# Patient Record
Sex: Female | Born: 1935
Health system: Southern US, Community
[De-identification: ages and names within clinical notes are randomized; demographics above are authoritative.]

## PROBLEM LIST (undated history)

## (undated) DIAGNOSIS — I10 Essential (primary) hypertension: Secondary | ICD-10-CM

## (undated) DIAGNOSIS — E871 Hypo-osmolality and hyponatremia: Secondary | ICD-10-CM

## (undated) HISTORY — PX: HUMERUS FRACTURE SURGERY: SHX670

---

## 1998-07-12 ENCOUNTER — Emergency Department (HOSPITAL_COMMUNITY): Admission: EM | Admit: 1998-07-12 | Discharge: 1998-07-12 | Payer: Self-pay | Admitting: Emergency Medicine

## 2010-10-09 ENCOUNTER — Inpatient Hospital Stay (HOSPITAL_COMMUNITY)
Admission: EM | Admit: 2010-10-09 | Discharge: 2010-10-14 | Payer: Self-pay | Source: Home / Self Care | Attending: Internal Medicine | Admitting: Internal Medicine

## 2010-10-14 LAB — POCT I-STAT, CHEM 8
Calcium, Ion: 1.16 mmol/L (ref 1.12–1.32)
Chloride: 90 mEq/L — ABNORMAL LOW (ref 96–112)
HCT: 38 % (ref 36.0–46.0)
Sodium: 125 mEq/L — ABNORMAL LOW (ref 135–145)
TCO2: 29 mmol/L (ref 0–100)

## 2010-10-14 LAB — DIFFERENTIAL
Basophils Relative: 0 % (ref 0–1)
Eosinophils Absolute: 0 10*3/uL (ref 0.0–0.7)
Lymphs Abs: 1.5 10*3/uL (ref 0.7–4.0)
Neutro Abs: 10.8 10*3/uL — ABNORMAL HIGH (ref 1.7–7.7)
Neutrophils Relative %: 80 % — ABNORMAL HIGH (ref 43–77)

## 2010-10-14 LAB — CBC
HCT: 30 % — ABNORMAL LOW (ref 36.0–46.0)
Hemoglobin: 12.1 g/dL (ref 12.0–15.0)
Hemoglobin: 10.4 g/dL — ABNORMAL LOW (ref 12.0–15.0)
Hemoglobin: 10.3 g/dL — ABNORMAL LOW (ref 12.0–15.0)
Platelets: 350 10*3/uL (ref 150–400)
Platelets: 299 10*3/uL (ref 150–400)
RBC: 3.86 MIL/uL — ABNORMAL LOW (ref 3.87–5.11)
RBC: 3.4 MIL/uL — ABNORMAL LOW (ref 3.87–5.11)
RBC: 3.28 MIL/uL — ABNORMAL LOW (ref 3.87–5.11)
WBC: 13.4 10*3/uL — ABNORMAL HIGH (ref 4.0–10.5)
WBC: 6.4 10*3/uL (ref 4.0–10.5)
WBC: 8 10*3/uL (ref 4.0–10.5)

## 2010-10-14 LAB — BASIC METABOLIC PANEL
CO2: 24 mEq/L (ref 19–32)
CO2: 25 mEq/L (ref 19–32)
Calcium: 8.6 mg/dL (ref 8.4–10.5)
Calcium: 8.3 mg/dL — ABNORMAL LOW (ref 8.4–10.5)
Chloride: 91 mEq/L — ABNORMAL LOW (ref 96–112)
Creatinine, Ser: 0.54 mg/dL (ref 0.4–1.2)
GFR calc Af Amer: >60 mL/min (ref >60)
Glucose, Bld: 206 mg/dL — ABNORMAL HIGH (ref 70–99)
Potassium: 3.2 mEq/L — ABNORMAL LOW (ref 3.5–5.1)
Sodium: 131 mEq/L — ABNORMAL LOW (ref 135–145)
Sodium: 125 mEq/L — ABNORMAL LOW (ref 135–145)

## 2010-10-14 LAB — LIPID PANEL
Cholesterol: 161 mg/dL (ref 0–200)
HDL: 67 mg/dL (ref >39)
Total CHOL/HDL Ratio: 2.4 RATIO

## 2010-10-14 LAB — COMPREHENSIVE METABOLIC PANEL
Albumin: 3 g/dL — ABNORMAL LOW (ref 3.5–5.2)
Alkaline Phosphatase: 69 U/L (ref 39–117)
BUN: 19 mg/dL (ref 6–23)
CO2: 27 mEq/L (ref 19–32)
Chloride: 93 mEq/L — ABNORMAL LOW (ref 96–112)
Glucose, Bld: 115 mg/dL — ABNORMAL HIGH (ref 70–99)
Potassium: 4 mEq/L (ref 3.5–5.1)
Total Bilirubin: 0.3 mg/dL (ref 0.3–1.2)

## 2010-10-14 LAB — URINE MICROSCOPIC-ADD ON

## 2010-10-14 LAB — URINALYSIS, ROUTINE W REFLEX MICROSCOPIC
Leukocytes, UA: NEGATIVE
Nitrite: POSITIVE — AB
Specific Gravity, Urine: 1.014 (ref 1.005–1.030)
pH: 7.5 (ref 5.0–8.0)

## 2010-10-14 LAB — CK TOTAL AND CKMB (NOT AT ARMC)
CK, MB: 3.5 ng/mL (ref 0.3–4.0)
Total CK: 149 U/L (ref 7–177)

## 2010-10-14 LAB — HEMOGLOBIN A1C: Hgb A1c MFr Bld: 5.8 % — ABNORMAL HIGH (ref <5.7)

## 2010-10-14 LAB — CORTISOL: Cortisol, Plasma: 15.9 ug/dL

## 2010-10-15 LAB — BASIC METABOLIC PANEL
BUN: 13 mg/dL (ref 6–23)
BUN: 17 mg/dL (ref 6–23)
CO2: 31 mEq/L (ref 19–32)
CO2: 27 mEq/L (ref 19–32)
Calcium: 8.6 mg/dL (ref 8.4–10.5)
Chloride: 91 mEq/L — ABNORMAL LOW (ref 96–112)
Chloride: 94 mEq/L — ABNORMAL LOW (ref 96–112)
Creatinine, Ser: 0.4 mg/dL (ref 0.4–1.2)
Creatinine, Ser: 0.45 mg/dL (ref 0.4–1.2)
Creatinine, Ser: 0.44 mg/dL (ref 0.4–1.2)
GFR calc Af Amer: >60 mL/min (ref >60)
GFR calc non Af Amer: >60 mL/min (ref >60)
Glucose, Bld: 97 mg/dL (ref 70–99)
Glucose, Bld: 127 mg/dL — ABNORMAL HIGH (ref 70–99)
Potassium: 3.3 mEq/L — ABNORMAL LOW (ref 3.5–5.1)
Potassium: 4.7 mEq/L (ref 3.5–5.1)
Sodium: 129 mEq/L — ABNORMAL LOW (ref 135–145)
Sodium: 127 mEq/L — ABNORMAL LOW (ref 135–145)

## 2010-10-15 LAB — GLUCOSE, CAPILLARY
Glucose-Capillary: 122 mg/dL — ABNORMAL HIGH (ref 70–99)
Glucose-Capillary: 123 mg/dL — ABNORMAL HIGH (ref 70–99)
Glucose-Capillary: 94 mg/dL (ref 70–99)
Glucose-Capillary: 152 mg/dL — ABNORMAL HIGH (ref 70–99)

## 2010-10-15 LAB — CBC
HCT: 33.5 % — ABNORMAL LOW (ref 36.0–46.0)
Hemoglobin: 11.3 g/dL — ABNORMAL LOW (ref 12.0–15.0)
MCH: 31.1 pg (ref 26.0–34.0)
MCHC: 33.7 g/dL (ref 30.0–36.0)
MCV: 92.3 fL (ref 78.0–100.0)
RBC: 3.63 MIL/uL — ABNORMAL LOW (ref 3.87–5.11)

## 2010-10-15 LAB — URINALYSIS, ROUTINE W REFLEX MICROSCOPIC
Nitrite: POSITIVE — AB
Protein, ur: 30 mg/dL — AB
Urobilinogen, UA: 1 mg/dL (ref 0.0–1.0)

## 2010-10-15 LAB — URINE MICROSCOPIC-ADD ON

## 2010-10-15 NOTE — Consult Note (Signed)
  NAMEARGENTINA, Gina Zavala                 ACCOUNT NO.:  [id number]  MEDICAL RECORD NO.:  [id number]          PATIENT TYPE:  INP  LOCATION:  3703                         FACILITY:  [facility]  PHYSICIAN:  Jene Every, M.D.    DATE OF BIRTH:  07-May-1922  DATE OF CONSULTATION:  10/09/2010 DATE OF DISCHARGE:                                CONSULTATION   CHIEF COMPLAINT:  Left shoulder pain.  HISTORY:  An 77 year old female who fell yesterday at home on to her left side.  She was admitted to the hospital with hyponatremia. Complains of hip and shoulder pain.  The shoulder indicated an impacted fracture of the humeral neck.  X-ray of the hip was negative and we are consulted for treatment of her surgical neck fracture of the humerus.  She denies numbness or tingling, fevers, or chills.  PAST MEDICAL HISTORY:  Significant for dementia.  Unclear as to the etiology of the patient's fall, anxiety, and hypertension.  SOCIAL HISTORY:  Negative tobacco.  Negative EtOH.  Nonsmoker.  Lives alone.  FAMILY HISTORY:  Noncontributory.  MEDICATIONS:  Antihypertensive medication.  PHYSICAL EXAMINATION:  GENERAL:  Elderly, mild distress.  Mood and affect appropriate.  In a sling in the hospital bed. VITALS:  Afebrile, pulse 98, BP 167/70. HEENT:  Within normal limits. COR:  Regular rate and rhythm. ABDOMEN:  Soft, nontender.  She has bruising of the left shoulder.  Pain with attempted motion of the shoulder. MUSCULOSKELETAL:  Compartments are soft.  Pulses in the radius and ulna distally are intact, good grip strength.  Forearm and elbow exam is unremarkable.  She has mild deformity and tenderness in the lumbosacral junction. BACK:  No pain on range of motion of the hip. PELVIS:  Stable. NEUROLOGIC:  Sensory exam is intact.  Radiographs of the shoulder demonstrates impacted fracture of the surgical neck, slightly displaced.  X-rays of the hip demonstrates no evidence of fracture.  LABORATORY  EVALUATION:  Sodium 145.  WBC is 13.4.  IMPRESSION:  Closed acute fracture of the surgical neck moderately displaced left humerus with neurologically intact.  Compartments are soft, history of dementia.  PLAN:  Discussed with she and her daughter.  Recommend conservative treatment at this point in time.  Ice and neurovascular checks with vital signs of the upper extremity.  Avoid flexion of the elbow beyond 90 degrees, avoid swelling in the hand, encourage range of motion of the hand and digits.  Follow up with Dr. Shelle Iron as an outpatient in 2 weeks for repeat radiographs and discuss possibility of hemiarthroplasty.     Jene Every, M.D.     [name]  D:  10/09/2010  T:  10/10/2010  Job:  [id number]  Electronically Signed by Jene Every M.D. on 10/15/2010 12:17:20 PM

## 2010-10-16 LAB — URINE CULTURE
Colony Count: >=100000
Culture  Setup Time: 201201221734

## 2010-10-28 NOTE — Discharge Summary (Signed)
NAME:  Gina Zavala, Gina Zavala NO.:  [id number]  MEDICAL RECORD NO.:  [id number]          PATIENT TYPE:  INP  LOCATION:  3703                         FACILITY:  [facility]  PHYSICIAN:  Mariea Stable, MD   DATE OF BIRTH:  [birth year]  DATE OF ADMISSION:  10/09/2010 DATE OF DISCHARGE:  10/14/2010                              DISCHARGE SUMMARY   DISCHARGE PHYSICIAN:  Mariea Stable, MD  DISCHARGE DIAGNOSES: 1. Left humerus fracture. 2. History of multiple falls. 3. Hyponatremia. 4. Hypertension.  DISCHARGE MEDICATIONS: 1. Amlodipine 5 mg tabs, take 1 tablet by mouth daily (Diuretics stopped due to chronic hyponatremia) 2. Ciprofloxacin 500 mg tabs, take 1 tablet twice daily by mouth. 3. Vicodin 5/325 mg tabs, take 1 tablet by mouth every 4 hours as     needed for pain. 4. Ibuprofen 200 mg tabs, take 1-2 tablets by mouth every 8 hours as     needed for pain.  DISPOSITION AND FOLLOWUP:  The patient is to follow up with Dr. Merla Riches, her primary care physician, his phone number is [phone] on January 30, between 2 p.m. and 8 p.m.  Because of lack of availability, the patient was asked to come to the walk-in clinic.  At that visit, the patient should have a BMET checked given that she continues to be hyponatremic here in the hospital though this appears to be chronic per her history.  This was stable during her hospitalization.  It is very important that the patient's safety at home living independently be continued to be discussed with the patient at that visit as it is possible that she may not be able to return home to live independently after her stay at the skilled nursing facility due to this acute humerus fracture.  The patient should followup with Dr. Shelle Iron in [facility], phone number [phone] on October 24, 2010 at 2:30 p.m.  At this visit, Dr. Shelle Iron is planning to perform x-rays of the fracture at that time to determine what future management may  be necessary.  The possibility of hemiarthroplasty will also be considered at that time. We will defer to Dr. Shelle Iron for further orthopedic management of this patient's fracture.  PROCEDURES PERFORMED: 1. Left complete hip x-ray was performed on October 09, 2010, which     demonstrated diffuse osteopenia, but no acute fracture. 2. A left shoulder x-ray was performed on October 09, 2010, which     demonstrated an acute slightly impacted and angulated left humeral     neck fracture.  CONSULTATIONS:  Orthopedic Surgery.  BRIEF ADMITTING HISTORY AND PHYSICAL:  This is a 77 year old female with no significant past medical history other than hypertension who presented to the ED with the main concern of left shoulder pain and swelling that started the day prior to admission.  The patient reportedly fell at 5:30 p.m. on the day prior to admission, this was secondary to a mechanical fall and the patient denied any loss of consciousness at that time.  She was unfortunately unable to get up and fortunately the patient's daughter found her and was able to assist her and brought  her to the ED.  The left arm was very painful and the daughter upon arrival to the ED noted multiple episodes of falls recently.  When the patient was further pressed on the issue, she does state that she does have some presyncopal symptoms prior to these episodes, but denied any presyncopal symptoms prior to the episode that lead to her fracture.  The patient on admission denied any chest pain, shortness of breath, change in her bowel movements or a recent fevers or chills.  ALLERGIES:  No known drug allergies.  PAST MEDICAL HISTORY:  Hypertension and chronic hyponatremia x2 years.  MEDICATIONS:  Bisoprolol and hydrochlorothiazide daily.  PHYSICAL EXAMINATION:  ADMISSION VITAL SIGNS:  Temperature 98.5, blood pressure 167/73, pulse 98, respiratory rate 19, satting at 96% on room air. GENERAL:  The patient was  sleepy, tired appearing, lying in bed. EYES:  Pupils are equal, round, reactive to light and accommodation. ENT:  Dry mucous membranes.  No oropharyngeal erythema or signs of head trauma. NECK:  Supple.  No thyroid enlargement, but there were bilateral carotid bruits. RESPIRATORY:  Good air movement bilaterally.  Minimal crackles at the bases.  No wheezing. CARDIOVASCULAR:  Regular rate and rhythm.  3/6 systolic murmur.  No JVD. Normal S1 and S2. GI: Soft, nontender, nondistended.  Normal bowel sounds. NEUROLOGIC:  No focal abnormalities. MUSCULOSKELETAL:  There is left shoulder tenderness and diffuse edema with a 5-cm ecchymosis in the medial aspect of the left arm.  LABORATORY DATA:  Labs on admission, white blood cell count 13.4, hemoglobin 12.1, hematocrit 35.1, platelets 350.  BMET, sodium 125, potassium 4.0, chloride 90, bicarb 29, BUN 25, creatinine 0.7, glucose of 158.  HOSPITAL COURSE BY PROBLEM: 1. Left humeral fracture:  This was evaluated by x-ray which     demonstrated an impacted and angulated left humeral neck fracture.     An Orthopedic Surgery consult was received and they recommended  conservative treatment with ice and neurovascular checks with vital     signs of the upper extremity.  They also recommended avoidance of     flexion of the elbow beyond 90 degrees to avoid swelling of the     hand and they recommended encouragement of range of motion exercise     in the hand and digits.  They wish to follow up with the patient in     2 weeks for repeat radiographs and discussion of possible     hemiarthroplasty.  The patient's pain was under good control on     only Vicodin and ibuprofen.  While she was in the hospital, she was     discharged with the same medications to assist in further pain     management. 2. History of multiple falls:  Upon further questioning, the patient     relates presyncopal symptoms during many of these falls and she was     placed on our  telemetry unit.  No arrhythmias were noted and her     EKG simply demonstrated normal sinus rhythm with a consideration of     left ventricular hypertrophy.  We did not have prior examination     with which to compare.  Orthostatic vital signs were gathered, but     the patient proved to not be orthostatic.  The patient was     evaluated by PT and OT and it was felt that she had a very steady     gait unrelated to her current fracture with excessive backward  leaning on while walking.  Because of this, it was recommended that     the patient go to skilled nursing facility after discharge for     further physical therapy and geriatric assessment to determine     whether or not the patient will be safe in the future to return     back home. 3. Hyponatremia:  The patient's sodium was 125 on admission.  This     corrected easily with normal saline administration.  On the day of     discharge, the patient's sodium had improved to 131.  Our     understanding is that this has been chronic and worked up by Dr.     Shelle Iron in the past and it was only recommended that the patient     increase her salt intake.  We did check cortisol and TSH levels     here as the patient appears to be euvolemic and the fact that her     sodium corrected so easily decreases the likelihood that she has     SIADH.  We did recommend that the patient continue her high-salt     diet and recommend that Dr. Shelle Iron continue to follow this for     further evaluation as the etiology continues to be unclear at this     time, but may be related to the patient's diuretic use on     admission.  As such, we did change her diuretic to Norvasc and her     blood pressure was well-controlled here. 4. Hypertension:  As mentioned above, the patient was previously on     both the diuretics and beta-blocker combination pill.  We     discontinued these on admission to the hospital and decided to     start her on 5 mg daily of Norvasc.  The  patient tolerated this     medication well and we would recommended continuation as it has     decreased the likelihood of causing electrolyte abnormalities.  On     the day of discharge, the patient's blood pressure was slightly     elevated and the last systolic reading was 149/77.  Given the     patient's age, we elected not to increase her dose especially since     she has had these episodes of syncope in the past, but recommend     continued following of the patient's blood pressure and adjustment     of her antihypertensive regimen should that felt to be necessary. 5. Osteopenia:  The patient's hip x-ray demonstrated diffuse     osteopenia, but this obviously was not quantified.  To our     knowledge, the patient has not had a DEXA scan and we would     recommend outpatient follow up with DEXA scan and consideration of     bisphosphonate or at least calcium and vitamin D supplementation.  DISCHARGE LABS:  On the day of discharge, the patient's white blood cell count was 7.9, hemoglobin 11.3, hematocrit 33.5, platelet count 400. BMET, sodium was 131, potassium 4.7, chloride 94, bicarb 27, glucose 127, BUN of 17, creatinine of 0.44.  DISCHARGE VITALS:  On the discharge, the patient's temperature was 98.4, pulse 78, respirations 20, blood pressure was 149/77, and her oxygen saturation was 97% on room air.     Sinda Du, MD   ______________________________ Mariea Stable, MD    BB/MEDQ  D:  10/14/2010  T:  10/14/2010  Job:  [id number]  cc:   Harrel Lemon. Merla Riches, M.D.  Electronically Signed by Sinda Du MD on 10/28/2010 08:53:49 AM Electronically Signed by Mariea Stable MD on 10/28/2010 09:30:36 AM

## 2011-09-01 ENCOUNTER — Encounter: Payer: Self-pay | Admitting: Internal Medicine

## 2011-09-01 DIAGNOSIS — N814 Uterovaginal prolapse, unspecified: Secondary | ICD-10-CM

## 2011-09-01 DIAGNOSIS — I10 Essential (primary) hypertension: Secondary | ICD-10-CM | POA: Insufficient documentation

## 2011-09-01 DIAGNOSIS — E871 Hypo-osmolality and hyponatremia: Secondary | ICD-10-CM

## 2011-09-01 DIAGNOSIS — R0989 Other specified symptoms and signs involving the circulatory and respiratory systems: Secondary | ICD-10-CM

## 2011-09-01 DIAGNOSIS — M419 Scoliosis, unspecified: Secondary | ICD-10-CM

## 2011-10-15 ENCOUNTER — Ambulatory Visit (INDEPENDENT_AMBULATORY_CARE_PROVIDER_SITE_OTHER): Payer: Medicare Other | Admitting: Internal Medicine

## 2011-10-15 DIAGNOSIS — I10 Essential (primary) hypertension: Secondary | ICD-10-CM

## 2011-10-15 DIAGNOSIS — M810 Age-related osteoporosis without current pathological fracture: Secondary | ICD-10-CM

## 2011-10-28 ENCOUNTER — Other Ambulatory Visit: Payer: Self-pay

## 2011-10-28 MED ORDER — AMLODIPINE BESYLATE 5 MG PO TABS: 5.0000 mg | ORAL_TABLET | Freq: Every day | ORAL | Status: DC

## 2011-12-17 ENCOUNTER — Ambulatory Visit: Payer: Medicare Other | Admitting: Internal Medicine

## 2011-12-18 ENCOUNTER — Telehealth: Payer: Self-pay

## 2011-12-18 NOTE — Telephone Encounter (Signed)
Dr. Merla Riches Patient   Has diarrhea and needs something to help now.  She is in assisted living at [facility] on [address]. Certain she has stomach virus.  Keopectade. Write an order and send to [facility]

## 2011-12-19 MED ORDER — BISMUTH SUBSALICYLATE 262 MG PO TABS: 1.0000 | ORAL_TABLET | Freq: Four times a day (QID) | ORAL | Status: DC | PRN

## 2011-12-19 NOTE — Telephone Encounter (Signed)
Called [facility] and they stated we can fax in rx to [facility] and they will dispense to patient.  Rx faxed to [phone].

## 2011-12-19 NOTE — Telephone Encounter (Signed)
Sure.  Gina Zavala

## 2011-12-19 NOTE — Telephone Encounter (Signed)
Can we do this ?

## 2012-01-14 ENCOUNTER — Ambulatory Visit (INDEPENDENT_AMBULATORY_CARE_PROVIDER_SITE_OTHER): Payer: Medicare Other | Admitting: Internal Medicine

## 2012-01-14 ENCOUNTER — Encounter: Payer: Self-pay | Admitting: Internal Medicine

## 2012-01-14 VITALS — BP 155/74 | HR 89 | Temp 98.5°F | Resp 18 | Ht <= 58 in | Wt 81.0 lb

## 2012-01-14 DIAGNOSIS — R0989 Other specified symptoms and signs involving the circulatory and respiratory systems: Secondary | ICD-10-CM | POA: Insufficient documentation

## 2012-01-14 DIAGNOSIS — N814 Uterovaginal prolapse, unspecified: Secondary | ICD-10-CM

## 2012-01-14 DIAGNOSIS — I10 Essential (primary) hypertension: Secondary | ICD-10-CM

## 2012-01-14 DIAGNOSIS — E871 Hypo-osmolality and hyponatremia: Secondary | ICD-10-CM

## 2012-01-14 DIAGNOSIS — M810 Age-related osteoporosis without current pathological fracture: Secondary | ICD-10-CM | POA: Insufficient documentation

## 2012-01-14 NOTE — Progress Notes (Signed)
  Subjective:    Patient ID: Gina Zavala, female    DOB: May 28, 1922, 78 y.o.   MRN: [id number]  HPIHere for followup 1. HTN (hypertension)   2. Hyponatremia   3. Uterine prolapse    Dr McDiarmid wants to do urodynamics --her incontinence is greatly affecting her life and Dr. Ambrose Mantle has declined to do surgery From a GYN aspect. She is distressed that she can see a vaginal prolapse and she is also troubled by fissuring of the skin in the rectovaginal area.  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  Eyes: Negative for visual disturbance.  Respiratory: Negative for cough and shortness of breath.   Cardiovascular: Negative for chest pain, palpitations and leg swelling.  Neurological: Negative for tremors and speech difficulty.  Psychiatric/Behavioral: Negative for behavioral problems, sleep disturbance, dysphoric mood, decreased concentration and agitation.       Objective:   Physical ExamVital signs are stable with her usual systolic hypertension Pupils equal round reactive to light EOMs conjugate Lungs are clear Heart is regular Carotid bruit on the right Neurological intact         Assessment & Plan:   1. HTN (hypertension)   2. Hyponatremia   3. Uterine prolapse    She will continue on Norvasc 5 mg She will proceed with urodynamics and potentially surgery She will followup in 3 months to recheck labs regarding hyponatremia She will continue vitamin D and calcium for her  osteoporosis Dr. Shelle Iron evaluated her marked scoliosis last month and said he thought she was fine and needed no further intervention/she had considered acupuncture but will wait to schedule this

## 2012-01-22 ENCOUNTER — Other Ambulatory Visit: Payer: Self-pay | Admitting: *Deleted

## 2012-01-22 MED ORDER — AMLODIPINE BESYLATE 5 MG PO TABS: 5.0000 mg | ORAL_TABLET | Freq: Every day | ORAL | Status: DC

## 2012-02-06 ENCOUNTER — Ambulatory Visit (INDEPENDENT_AMBULATORY_CARE_PROVIDER_SITE_OTHER): Payer: Medicare Other | Admitting: Internal Medicine

## 2012-02-06 VITALS — BP 206/71 | HR 88 | Temp 98.0°F | Resp 16 | Ht <= 58 in | Wt 80.4 lb

## 2012-02-06 DIAGNOSIS — I10 Essential (primary) hypertension: Secondary | ICD-10-CM

## 2012-02-07 ENCOUNTER — Encounter: Payer: Self-pay | Admitting: Internal Medicine

## 2012-02-07 NOTE — Progress Notes (Signed)
  Subjective:    Patient ID: Gina Zavala, female    DOB: 1922-01-20, 77 y.o.   MRN: [id number]  HPIMs. 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 She continues to do well in her assisted living arrangement, using a walker for support. She has the following issues Patient Active Problem List  Diagnoses  . HTN (hypertension)  . Scoliosis  . Carotid bruit present  . Hyponatremia  . Uterine prolapse  . Carotid bruit  . Osteoporosis  Her osteoporosis and scoliosis lead to a fair amount of back discomfort for which she consults with Dr. Shelle Iron.  She has no current complaint. Her systolic hypertension has been difficult to control. She has suffered hypotension and syncope with several medications, and she refuses to take medications as soon as she becomes dizzy. Her blood pressures at her living institution are much better than the ones when she comes into our office. Her uterine prolapse causes urinary problems which are currently under evaluation     Review of SystemsShe has no other complaints/at the current time she is extremely excited about this opportunity for publication of her life's work     Objective:   Physical Exam Weight is stable Hearing is adequate in conversation Gait is stable with walker Heart has a regular rhythm No peripheral vascular compromises Neurological  is intact Thought and judgment are intact        Assessment & Plan:  She is indeed medically able to travel to support the publication of her music and poetry  She will followup for blood work as planned before continuing her prescriptions I continue to honor her wishes for the least amount of intervention possible

## 2012-03-21 ENCOUNTER — Other Ambulatory Visit: Payer: Self-pay | Admitting: Family Medicine

## 2012-03-21 MED ORDER — AMLODIPINE BESYLATE 5 MG PO TABS: 5.0000 mg | ORAL_TABLET | Freq: Every day | ORAL | Status: DC

## 2012-04-14 ENCOUNTER — Ambulatory Visit (INDEPENDENT_AMBULATORY_CARE_PROVIDER_SITE_OTHER): Payer: Medicare Other | Admitting: Internal Medicine

## 2012-04-14 ENCOUNTER — Encounter: Payer: Self-pay | Admitting: Internal Medicine

## 2012-04-14 ENCOUNTER — Telehealth: Payer: Self-pay

## 2012-04-14 VITALS — BP 160/76 | HR 82 | Temp 98.4°F | Resp 18 | Ht <= 58 in | Wt 79.0 lb

## 2012-04-14 DIAGNOSIS — J301 Allergic rhinitis due to pollen: Secondary | ICD-10-CM

## 2012-04-14 DIAGNOSIS — R350 Frequency of micturition: Secondary | ICD-10-CM

## 2012-04-14 DIAGNOSIS — I10 Essential (primary) hypertension: Secondary | ICD-10-CM

## 2012-04-14 DIAGNOSIS — N814 Uterovaginal prolapse, unspecified: Secondary | ICD-10-CM

## 2012-04-14 DIAGNOSIS — E871 Hypo-osmolality and hyponatremia: Secondary | ICD-10-CM

## 2012-04-14 DIAGNOSIS — H6980 Other specified disorders of Eustachian tube, unspecified ear: Secondary | ICD-10-CM

## 2012-04-14 MED ORDER — FLUTICASONE PROPIONATE 50 MCG/ACT NA SUSP: 2.0000 | Freq: Every day | NASAL | Status: DC

## 2012-04-14 NOTE — Progress Notes (Signed)
  Subjective:    Patient ID: Gina Zavala, female    DOB: 10/06/1921, 78 y.o.   MRN: [id number]  HPI Patient Active Problem List  Diagnosis  . HTN (hypertension)-Has had no change in symptoms/complaints that her blood pressure medicine causes a dry mouth and would like to consider a different medication/she has had side effects to other medications and we have changed several times to reach current dose of amlodipine 5 mg  . Scoliosis  . Carotid bruit present  . Hyponatremia-Time to recheck labs/no change in sensorium/no polyuria polydipsia/no neurological changes  . Uterine prolapse-pessary removed by Dr Thurmond Butts voiding somewhat better/If urinary frequency increases she will followup with urology to consider hysterectomy for other alternatives  . Carotid bruit  . Osteoporosis   Today she has a new complaint of ringing in her right ear and a feeling of fullness with muffled hearing in the left ear that comes and goes. This has been increasing over the last 2 or 3 months. She also has a lot of postnasal drip and a lot of congestion in the eyes with irritation especially with her roommates use of Frag[employer]. Her left ear pops when she swallows.  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  Eyes: Negative for visual disturbance.  Respiratory: Negative for shortness of breath.   Cardiovascular: Negative for chest pain and leg swelling.  Neurological: Negative for dizziness, syncope and weakness.  Psychiatric/Behavioral: Negative for behavioral problems, confusion, disturbed wake/sleep cycle, dysphoric mood and decreased concentration. The patient is not nervous/anxious.        Objective:   Physical Exam Filed Vitals:   04/14/12  1407  BP: 160/76  Pulse: 82  Temp: 98.4 F (36.9 C)  Resp: 18   In no acute distress Tympanic membranes and canals are clear Nares boggy with clear rhinorrhea No nodes or thyromegaly Heart regular with no murmur No peripheral edema/full peripheral pulses Oriented well Gait with walker good        Assessment & Plan:   1. Essential hypertension, benign  CBC with Differential, Lipid panel, Comprehensive metabolic panel,   2. Allergic rhinitis   3. Hyposmolality and/or hyponatremia  CBC with Differential, Lipid panel, Comprehensive metabolic panel, Osmolality  4. ETD (eustachian tube dysfunction) -Secondary #2 Flonase daily to be picked up by her daughter and brought to the nursing home so she can control  use  5. Frequency of urination    6. Uterine prolapse     She will followup in one week so that blood work in the fasting

## 2012-04-15 ENCOUNTER — Other Ambulatory Visit (INDEPENDENT_AMBULATORY_CARE_PROVIDER_SITE_OTHER): Payer: Medicare Other | Admitting: Family Medicine

## 2012-04-15 DIAGNOSIS — I10 Essential (primary) hypertension: Secondary | ICD-10-CM

## 2012-04-15 DIAGNOSIS — J301 Allergic rhinitis due to pollen: Secondary | ICD-10-CM

## 2012-04-15 DIAGNOSIS — E871 Hypo-osmolality and hyponatremia: Secondary | ICD-10-CM

## 2012-04-15 LAB — CBC WITH DIFFERENTIAL/PLATELET
Basophils Relative: 0 % (ref 0–1)
Eosinophils Absolute: 0.1 10*3/uL (ref 0.0–0.7)
Eosinophils Relative: 2 % (ref 0–5)
Lymphs Abs: 2.1 10*3/uL (ref 0.7–4.0)
MCH: 31.2 pg (ref 26.0–34.0)
MCHC: 34.2 g/dL (ref 30.0–36.0)
MCV: 91.2 fL (ref 78.0–100.0)
Monocytes Relative: 9 % (ref 3–12)
Neutrophils Relative %: 56 % (ref 43–77)
Platelets: 396 10*3/uL (ref 150–400)
RBC: 4.68 MIL/uL (ref 3.87–5.11)

## 2012-04-15 LAB — LIPID PANEL
Cholesterol: 221 mg/dL — ABNORMAL HIGH (ref 0–200)
Total CHOL/HDL Ratio: 2.8 Ratio
VLDL: 16 mg/dL (ref 0–40)

## 2012-04-15 LAB — COMPREHENSIVE METABOLIC PANEL
AST: 24 U/L (ref 0–37)
Albumin: 4.6 g/dL (ref 3.5–5.2)
Alkaline Phosphatase: 103 U/L (ref 39–117)
Calcium: 10.6 mg/dL — ABNORMAL HIGH (ref 8.4–10.5)
Chloride: 95 mEq/L — ABNORMAL LOW (ref 96–112)
Potassium: 4.2 mEq/L (ref 3.5–5.3)
Sodium: 133 mEq/L — ABNORMAL LOW (ref 135–145)
Total Protein: 7.1 g/dL (ref 6.0–8.3)

## 2012-04-22 ENCOUNTER — Encounter: Payer: Self-pay | Admitting: Internal Medicine

## 2012-05-19 ENCOUNTER — Ambulatory Visit (INDEPENDENT_AMBULATORY_CARE_PROVIDER_SITE_OTHER): Payer: Medicare Other | Admitting: Internal Medicine

## 2012-05-19 ENCOUNTER — Encounter: Payer: Self-pay | Admitting: Internal Medicine

## 2012-05-19 VITALS — HR 67 | Temp 97.9°F | Resp 16 | Wt 76.8 lb

## 2012-05-19 DIAGNOSIS — E871 Hypo-osmolality and hyponatremia: Secondary | ICD-10-CM

## 2012-05-19 DIAGNOSIS — I10 Essential (primary) hypertension: Secondary | ICD-10-CM

## 2012-05-19 NOTE — Progress Notes (Signed)
  Subjective:    Patient ID: Gina Zavala, female    DOB: [birth year], 78 y.o.   MRN: [id number]  HPI Patient Active Problem List  Diagnosis  . HTN (hypertension)  . Scoliosis  . Carotid bruit present  . Hyponatremia  . Uterine prolapse  . Carotid bruit  . 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 HTN (hypertension)  . Scoliosis  . Carotid bruit present  . Hyponatremia  . Uterine prolapse  . Carotid bruit  . Osteoporosis  No need to repeat labs since everything was improving or stable in July Continue flonase  Til November No change in BP meds

## 2012-07-03 ENCOUNTER — Telehealth: Payer: Self-pay | Admitting: Family Medicine

## 2012-07-03 NOTE — Telephone Encounter (Signed)
Called patient and the number is [employer]. They transferred the call and it just rang. No answer and unable to leave message. Called home number listed and it is no longer in service. Will try back later.

## 2012-07-03 NOTE — Telephone Encounter (Signed)
Message copied by Eddie Candle on Sat Jul 03, 2012  8:31 AM ------      Message from: Tonye Pearson      Created: Fri Jul 02, 2012  4:24 PM      Regarding: FWLANDY MACE      Contact: [phone]       Can you find out about her message and I'll deal w/ it tomorrow?      ----- Message -----         From: Leonia Reeves         Sent: 06/28/2012   9:30 AM           To: Tonye Pearson, MD      Subject: Heloise Ochoa                                              Dr. Merla Riches,       I had the pleasure of speaking with Ms. Nies this morning. She is adamant to speak with you concerning her medication. She believes that it is causing her to have blurred vision. I informed her that you do not have any appointments until the end of the month but she says she needs to see you and no one else.       Her number is [phone]            Thanks,       Iran Planas.

## 2012-07-07 ENCOUNTER — Telehealth: Payer: Self-pay | Admitting: Radiology

## 2012-07-07 NOTE — Telephone Encounter (Signed)
Patient c/o dry mouth. She had one episode today of low o2 sats her aide is advised to bring her here or to ER today. She is now showing 02 sat of 97%

## 2012-07-07 NOTE — Telephone Encounter (Signed)
Left a message for her at [facility] place, she is not up yet. She is going to call me back.

## 2012-07-07 NOTE — Telephone Encounter (Signed)
Message copied by Caffie Damme on Wed Jul 07, 2012  9:19 AM ------      Message from: Honor Loh      Created: Tue Jul 06, 2012 11:32 AM      Regarding: FWHeloise Ochoa      Contact: [phone]                   ----- Message -----         From: Tonye Pearson, MD         Sent: 07/02/2012   4:24 PM           To: [facility] Clinical Message Pool      Subject: FW: Heloise Ochoa                                          Can you find out about her message and I'll deal w/ it tomorrow?      ----- Message -----         From: Leonia Reeves         Sent: 06/28/2012   9:30 AM           To: Tonye Pearson, MD      Subject: Heloise Ochoa                                              Dr. Merla Riches,       I had the pleasure of speaking with Ms. Prichett this morning. She is adamant to speak with you concerning her medication. She believes that it is causing her to have blurred vision. I informed her that you do not have any appointments until the end of the month but she says she needs to see you and no one else.       Her number is [phone]            Thanks,       Iran Planas.

## 2012-07-21 ENCOUNTER — Encounter: Payer: Self-pay | Admitting: Internal Medicine

## 2012-07-21 ENCOUNTER — Ambulatory Visit (INDEPENDENT_AMBULATORY_CARE_PROVIDER_SITE_OTHER): Payer: Medicare Other | Admitting: Internal Medicine

## 2012-07-21 VITALS — BP 140/78 | HR 74 | Temp 98.0°F | Resp 18 | Ht <= 58 in | Wt 77.0 lb

## 2012-07-21 DIAGNOSIS — T887XXA Unspecified adverse effect of drug or medicament, initial encounter: Secondary | ICD-10-CM

## 2012-07-21 DIAGNOSIS — I10 Essential (primary) hypertension: Secondary | ICD-10-CM

## 2012-07-21 DIAGNOSIS — Z23 Encounter for immunization: Secondary | ICD-10-CM

## 2012-07-21 MED ORDER — SPIRONOLACTONE 25 MG PO TABS: 25.0000 mg | ORAL_TABLET | Freq: Every day | ORAL | Status: DC

## 2012-07-21 NOTE — Patient Instructions (Addendum)
Stop norvasc Start srironolactone 25mg  Record daily BP and send me record in 2 weeks Recheck 1 month

## 2012-07-21 NOTE — Progress Notes (Signed)
  Subjective:    Patient ID: Gina Zavala, female    DOB: October 21, 1921, 78 y.o.   MRN: [id number]  CC: 78 yo W F c/o double vision and dry mouth  HPI She c/o Norvasc giving her dry mouth and double vision.    Her mouth is so dry that she has had episodes of choking.  At one point she was choking in the hallway and her O2 was checked and found to be 80%.  She was getting out of the shower and when she went to look in the mirror she had double vision.  In the room she takes her glasses off and says her vision gets worse.  She recently saw her eye dr who did not find any problems.  She has done research on different bp medications and discusses her findings during the appt.  She expresses interest in trying a diuretic.  We discussed stopping all medications and getting daily bp's to see if her aforementioned symptoms resolve and how her bp is w/o medication.  She expressed concern about not being on any bp medication.  She says her bp is typically good but occasionally spikes especially when she is upset.  She lives in a nursing home and has access to daily bp readings.    PMHx: Patient Active Problem List  Diagnosis  . HTN (hypertension)  . Scoliosis  . Carotid bruit present  . Hyponatremia  . Uterine prolapse  . Carotid bruit  . 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   Vitals: Filed Vitals:   07/21/12 1348  BP: 166/90  Pulse: 74  Temp: 98 F (36.7 C)  Resp: 18  HEENT: Nontraumatic, EOMIT, Wears glasses, sees well grossly,normal to external exam, trachea midline Heart: Regular rate and rhythm Lungs: No acute respiratory distress, no audible wheezing MSK: Decreased bulk with normal tone Neuro: Alert, oriented CN II - XII grossly IT/gait aided by walker Vascular: Radial pulse  2/4, no extremity edema     Assessment & Plan:  Assessment: 1) Dry mouth 2) Blurry vision 3) HTN  Plan: 1) DC Norvasc and start Spironolactone 25 mg qd 2) Daily bp log for 2 wks to be sent to me  F/u 58mo

## 2012-08-02 ENCOUNTER — Telehealth: Payer: Self-pay

## 2012-08-02 NOTE — Telephone Encounter (Signed)
DC Norvasc and start Spironolactone 25 mg qd  Norvasc d/c due to side effects, now she has dizziness ? From new med please advise.

## 2012-08-02 NOTE — Telephone Encounter (Signed)
Patient is calling back stating that she is needing to be back on her old medicine.   Best#: [phone]  Fax#: [phone]  Aaron Edelman

## 2012-08-02 NOTE — Telephone Encounter (Signed)
Patient states that she would like to be taken off her medicine that she is on now and put back on what she had before   Best#: [phone] Fax#: [phone] Aaron Edelman     Gina Zavala 08/02/2012 4:06 PM Signed  DR Merla Riches,  PT STATES THAT HER MEDICATION IS MAKING HER DIZZY AND SHE IS HAVING BLURRED VISION.  PLEASE CALL HER @ [phone]

## 2012-08-02 NOTE — Telephone Encounter (Signed)
DR Merla Riches,  PT STATES THAT HER MEDICATION IS MAKING HER DIZZY AND SHE IS HAVING BLURRED VISION. PLEASE CALL HER @ [phone]

## 2012-08-03 ENCOUNTER — Other Ambulatory Visit: Payer: Self-pay | Admitting: Radiology

## 2012-08-03 NOTE — Telephone Encounter (Signed)
Thanks, I have advised the Med Tech at the [facility]. They need order faxed. [phone] attn Lynnda Child this is faxed.

## 2012-08-03 NOTE — Telephone Encounter (Signed)
I'm not so sure going back on Norvasc is the best idea after reviewing her previous note. Why don't we just stop her Spironolactone all together and have her check her BP's over the next couple of days and will forward them to Dr. Merla Riches. Should any of her symptoms persist or worsen she is to go to the ER.

## 2012-08-04 ENCOUNTER — Telehealth: Payer: Self-pay

## 2012-08-04 NOTE — Telephone Encounter (Signed)
Pt of [facility].  Her BP medicine was recently changed and it is not agreeing with her.  It is making her dizzy.  She said she would like to go back on her old one, Norvac.  Says at her retirement home, to have this changed a fax must be sent in.  Please send fax to Lynnda Child, LPN at [phone] (fax).  Gina Zavala's phone is [phone]

## 2012-08-05 ENCOUNTER — Encounter: Payer: Self-pay | Admitting: Physician Assistant

## 2012-08-05 MED ORDER — AMLODIPINE BESYLATE 5 MG PO TABS: 2.5000 mg | ORAL_TABLET | Freq: Every day | ORAL | Status: DC

## 2012-08-05 NOTE — Telephone Encounter (Signed)
Letter faxed.

## 2012-08-05 NOTE — Telephone Encounter (Signed)
Thanks, can you call me about this? [phone] Amy

## 2012-08-05 NOTE — Telephone Encounter (Signed)
Letter written.  Please print and fax as requested.

## 2012-08-05 NOTE — Telephone Encounter (Signed)
Okay to fax order as put in meds in order  For 2.5 Norvasc

## 2012-08-05 NOTE — Telephone Encounter (Signed)
Message was left on nurse VM from [facility] asking for [place] to send an order to D/C pt's current BP med in order for them to start giving pt the new order for Amlodipine that we faxed. Can an order please be written to D/C

## 2012-08-25 ENCOUNTER — Ambulatory Visit (INDEPENDENT_AMBULATORY_CARE_PROVIDER_SITE_OTHER): Payer: Medicare Other | Admitting: Internal Medicine

## 2012-08-25 ENCOUNTER — Encounter: Payer: Self-pay | Admitting: Internal Medicine

## 2012-08-25 VITALS — BP 201/91 | HR 83 | Temp 98.2°F | Resp 22 | Ht <= 58 in | Wt 77.0 lb

## 2012-08-25 DIAGNOSIS — M419 Scoliosis, unspecified: Secondary | ICD-10-CM

## 2012-08-25 DIAGNOSIS — I10 Essential (primary) hypertension: Secondary | ICD-10-CM

## 2012-08-25 DIAGNOSIS — M549 Dorsalgia, unspecified: Secondary | ICD-10-CM

## 2012-08-25 DIAGNOSIS — M810 Age-related osteoporosis without current pathological fracture: Secondary | ICD-10-CM

## 2012-08-25 DIAGNOSIS — M4120 Other idiopathic scoliosis, site unspecified: Secondary | ICD-10-CM

## 2012-08-25 MED ORDER — AMLODIPINE BESYLATE 5 MG PO TABS: 2.5000 mg | ORAL_TABLET | Freq: Every day | ORAL | Status: DC

## 2012-08-25 NOTE — Patient Instructions (Addendum)
Allow wine- 1/2 to 1 glass with evening meal Continue norvasc 5 mg Recheck 3 months Refer to Dr Shelle Iron for evaluation of scoliosis/back pain

## 2012-08-25 NOTE — Progress Notes (Signed)
  Subjective:    Patient ID: Gina Zavala, female    DOB: [birth year], 78 y.o.   MRN: [id number]  HPItaking one tab norvasc now w/out any side effects(2.5mg )-blood pressures at the nursing home are variable including some as low as 100 systolic/no episodes of dizziness or syncope since changing to this dose  sHe is concerned about her scoliosis getting worse with her inability to stand up straight and walk well She has seen Dr. 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 allow move back home Has created a "writer's village " at the [facility] Digby-cured blurred vision Review of Systems No chest pain/palpitations No further syncope or dizziness   no fatigue/no weight loss/no cough/no fever or night sweats No complaints regarding her uterine prolapse at this visit Objective:   Physical Exam Blood pressure 201/91 although she is very agitated with a CNA who is taking her blood pressure She refused to have his blood pressure taken first HEENT is clear gait stabilized by walker Heart is regular/no murmur/carotid bruit-remains asymptomatic Kyphotic posture with scoliosis Alert and oriented to time person and place Mood is great/affect very appropriate      Assessment & Plan:   1. Back pain  Ambulatory referral to Orthopedic Surgery  2. Scoliosis    3. Osteoporosis    4. HTN (hypertension)     she is interested in the possibility of a back brace  Continue Norvasc 2.5 mg with blood pressure monitoring at the nursing home She will not agree to take other medicines and has discontinued spironolactone She will followup in 2 months for labs and further checkup

## 2012-09-24 ENCOUNTER — Telehealth: Payer: Self-pay | Admitting: *Deleted

## 2012-09-24 NOTE — Telephone Encounter (Signed)
Discontinued at her request

## 2012-09-24 NOTE — Telephone Encounter (Signed)
Pharmacy is requesting a refill on sprionolactone.  Discussed at last visit that she had discontinued this.

## 2012-10-20 ENCOUNTER — Ambulatory Visit: Payer: Medicare Other | Admitting: Internal Medicine

## 2012-12-01 ENCOUNTER — Ambulatory Visit: Payer: Medicare Other

## 2012-12-01 ENCOUNTER — Ambulatory Visit (INDEPENDENT_AMBULATORY_CARE_PROVIDER_SITE_OTHER): Payer: Medicare Other | Admitting: Internal Medicine

## 2012-12-01 ENCOUNTER — Encounter: Payer: Self-pay | Admitting: Internal Medicine

## 2012-12-01 VITALS — BP 140/74 | HR 87 | Temp 98.8°F | Resp 16 | Wt 75.4 lb

## 2012-12-01 DIAGNOSIS — R05 Cough: Secondary | ICD-10-CM

## 2012-12-01 DIAGNOSIS — J209 Acute bronchitis, unspecified: Secondary | ICD-10-CM

## 2012-12-01 MED ORDER — PROMETHAZINE-DM 6.25-15 MG/5ML PO SYRP: 5.0000 mL | ORAL_SOLUTION | Freq: Four times a day (QID) | ORAL | Status: DC | PRN

## 2012-12-01 MED ORDER — AZITHROMYCIN 250 MG PO TABS: ORAL_TABLET | ORAL | Status: DC

## 2012-12-01 NOTE — Progress Notes (Signed)
  Subjective:    Patient ID: Gina Zavala, female    DOB: Apr 07, 1922, 79 y.o.   MRN: [id number]  HPI has been coughing for 2-3 weeks/poor sleep/cough occasionally productive of yellow sputum No fever or chills/no night sweats/no weight loss Appetite good tires easily/shortness of breath with activity  Just got copy of recently published book "all over and the Christmas Robyn"-she presented me with an autographed copy  Also complaining of ear pain with slight decrease in hearing  Review of Systems No chest pain or palpitations No peripheral edema No paroxysmal nocturnal dyspnea No abdominal distention    Objective:   Physical Exam BP 140/74  Pulse 87  Temp(Src) 98.8 F (37.1 C) (Oral)  Resp 16  Wt 75 lb 6.4 oz (34.201 kg)  SpO2 97% In no acute distress but cough sounds wet and deep Nares clear/TMs clear/canals clear/throat clear No nodes or thyromegaly Chest with distant breath sounds/rhonchi anteriorly Dorsal kyphosis marked Heart regular with a rate of 75 and no murmur or click Abdomen is not distended Extremities with good peripheral pulses and no edema She is oriented and alert   [facility] reading (PRIMARY) by  Dr. Doolittle=?incr mrking RLL./Chronic interstitial changes/no cardiomegaly or pleural effusion/no mass      Assessment & Plan:  Prolonged lower sparked or infection consistent with a chronic obstructive disease with secondary infection. With no fever will try broad coverage in stopping the cough at night.  Meds ordered this encounter  Medications  . azithromycin (ZITHROMAX) 250 MG tablet    Sig: As packaged    Dispense:  6 tablet    Refill:  0  . promethazine-dextromethorphan (PROMETHAZINE-DM) 6.25-15 MG/5ML syrup    Sig: Take 5 mLs by mouth 4 (four) times daily as needed for cough.    Dispense:  118 mL    Refill:  0  Followup 2 weeks/sooner for worse

## 2012-12-01 NOTE — Patient Instructions (Addendum)
If not well in 2 weeks should recheck Start meds today

## 2012-12-03 ENCOUNTER — Other Ambulatory Visit: Payer: Self-pay

## 2012-12-03 ENCOUNTER — Telehealth: Payer: Self-pay | Admitting: Radiology

## 2012-12-03 MED ORDER — SPIRONOLACTONE 25 MG PO TABS: 25.0000 mg | ORAL_TABLET | Freq: Every day | ORAL | Status: DC

## 2012-12-03 MED ORDER — AZITHROMYCIN 250 MG PO TABS: ORAL_TABLET | ORAL | Status: DC

## 2012-12-03 NOTE — Telephone Encounter (Signed)
Dr Merla Riches gave me a form. Patient should also be on Zithromax. This is resent to them. [facility] did not have sig.

## 2012-12-03 NOTE — Telephone Encounter (Signed)
Called patients [facility] living facility. No response. Will try again.

## 2012-12-03 NOTE — Telephone Encounter (Signed)
Harriett Sine, medical asst from [facility] called to request that we fax a new Rx for spirolactone to them at [phone] for pt to restart. She reported that pt states she feels better when she takes it. Dr Merla Riches, you have DCd it at Dec 2013 OV because pt reported she had stopped taking it. Do you want to Rx it again, since pt requests to be back on it? I have pended it to print so that we can fax it if agreed.

## 2012-12-04 ENCOUNTER — Other Ambulatory Visit: Payer: Self-pay | Admitting: *Deleted

## 2012-12-04 MED ORDER — AMLODIPINE BESYLATE 5 MG PO TABS: 2.5000 mg | ORAL_TABLET | Freq: Every day | ORAL | Status: DC

## 2012-12-06 NOTE — Telephone Encounter (Signed)
zithromax and sprinolactone sent per Dr Merla Riches

## 2012-12-07 ENCOUNTER — Telehealth: Payer: Self-pay

## 2012-12-07 MED ORDER — SPIRONOLACTONE 25 MG PO TABS: 25.0000 mg | ORAL_TABLET | Freq: Every day | ORAL | Status: DC

## 2012-12-07 NOTE — Telephone Encounter (Signed)
This was sent with her Rx for her Zithromax.

## 2012-12-07 NOTE — Telephone Encounter (Signed)
Pt and Harriett Sine called from [facility]. They are waiting for a refill authorization on her diuretic. Harriett Sine said she talked to [place] on Friday but is still awaiting a fax from [place]. Harriett Sine phone [phone] fax [phone]

## 2012-12-07 NOTE — Telephone Encounter (Signed)
She has taken zithromax. She did not get the spirinolactone. This is refaxed

## 2012-12-07 NOTE — Telephone Encounter (Signed)
Sent fax/ can not get through on the phone.

## 2013-02-02 ENCOUNTER — Ambulatory Visit: Payer: Medicare Other | Admitting: Internal Medicine

## 2013-02-16 ENCOUNTER — Telehealth: Payer: Self-pay

## 2013-02-16 NOTE — Telephone Encounter (Signed)
Dr Merla Riches  Patient is calling to let you know that [facility] is going to be calling to request patient to have a back brace she would like for you to say this is ok when they call

## 2013-03-23 ENCOUNTER — Ambulatory Visit (INDEPENDENT_AMBULATORY_CARE_PROVIDER_SITE_OTHER): Payer: Medicare Other | Admitting: Internal Medicine

## 2013-03-23 DIAGNOSIS — I10 Essential (primary) hypertension: Secondary | ICD-10-CM

## 2013-03-23 MED ORDER — SPIRONOLACTONE 25 MG PO TABS: 25.0000 mg | ORAL_TABLET | Freq: Every day | ORAL | Status: DC

## 2013-03-23 MED ORDER — AMLODIPINE BESYLATE 5 MG PO TABS: 2.5000 mg | ORAL_TABLET | Freq: Every day | ORAL | Status: DC

## 2013-03-23 NOTE — Progress Notes (Signed)
  Subjective:    Patient ID: Gina Zavala, female    DOB: 08-16-1922, 79 y.o.   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 Carotid bruit 01/14/2012  . Osteoporosis 01/14/2012  . HTN (hypertension) 09/01/2011  . Scoliosis 09/01/2011  . Hyponatremia 09/01/2011  . Uterine prolapse 09/01/2011   Doing well No recent dizziness or falling Working w/ trainer on evxercises Still asking daughter to let her move home Wants to disc diuretics/BP med categories     Review of Systems     Objective:   Physical Exam Does not want Bp today--feeling fine but will come in for labs and BP next f/u Ht regular/lungs clear extr clear Oriented well       Assessment & Plan:  HTN hyponatr in past  No chg meds/call if out  F/u 2 mos

## 2013-04-06 ENCOUNTER — Telehealth: Payer: Self-pay

## 2013-04-06 NOTE — Telephone Encounter (Signed)
PATIENT STATES DR. Merla Riches PUT HER ON NORVASC 2.5MG . SHE NEEDS TO HAVE A NOTE FAXED TO THE [facility] WHERE SHE LIVES TO GET IT DISCONTINUED. IT GIVES HER DRY MOUTH AND HEART BURN. THEY WILL NOT STOP HER FROM TAKING IT UNTIL THEY RECEIVE SOMETHING IN WRITING.  BEST PHONE IF QUESTIONS [phone] FAX # [phone] (ATTN) NANCY     MBC

## 2013-04-06 NOTE — Telephone Encounter (Signed)
Faxed written order to d/c norvasc.

## 2013-04-06 NOTE — Telephone Encounter (Signed)
Call them w/ verbal order to d/c norvasc til f/u appt in sept

## 2013-04-18 ENCOUNTER — Telehealth: Payer: Self-pay

## 2013-04-18 MED ORDER — TEARS RENEWED OP SOLN: 1.0000 [drp] | Freq: Four times a day (QID) | OPHTHALMIC | Status: DC | PRN

## 2013-04-18 NOTE — Telephone Encounter (Signed)
Pended Rx for this, please print and sign at your convenience, will fax

## 2013-04-18 NOTE — Telephone Encounter (Signed)
Pt called from [facility] and is wanting to get dr Merla Riches to fax over an order to them that it is ok for her to use her own artifical tears solution and to apply it herself as she feels necessary  Fax number is [phone]

## 2013-04-25 ENCOUNTER — Telehealth: Payer: Self-pay

## 2013-04-25 NOTE — Telephone Encounter (Signed)
Do you want her to come in ? Or do you want to adjust her meds by fax and see how she does?

## 2013-04-25 NOTE — Telephone Encounter (Signed)
Pt called requesting some changes to her medications due to side effects of dizziness and almost falling.  States her aldactone makes her dizzy and almost fell.  Would like a 'stop order' faxed asap to [facility] at [phone]  Stated she wants to be back on her previous medication Norvasc and also a refill on her spironolactone.   When i looked up her meds for the proper spelling i saw that spironolactone and aldactone are the same thing. Please call pt to verify what she would like done.   Pt best: [phone]  bf

## 2013-04-26 NOTE — Telephone Encounter (Signed)
PT STATES SHE DOESN'T WANT TO ARTIFICIAL TEARS IN GENERIC AND WAS TOLD BY THE [facility] SHE CAN GO BY HER OWN AND PUT THEM IN HER EYES AND SHE WOULD LIKE A CONFIRMATION THAT IT IS OK FOR HER TO DO. ALSO WOULD LIKE TO GO BACK TO THE FIRST MEDICATION SHE WAS PUT ON. PLEASE CALL [phone] AND THE FAX TO [facility] IS [phone]

## 2013-05-04 NOTE — Telephone Encounter (Signed)
Call the facility with verbal order to stop all BP meds and record daily BP for 14 days-then fax me the results

## 2013-05-05 NOTE — Telephone Encounter (Signed)
Faxed order, they will not take verbal

## 2013-05-06 ENCOUNTER — Telehealth: Payer: Self-pay

## 2013-05-06 NOTE — Telephone Encounter (Signed)
Pt is calling because she ran out of her bp meds and she hasn't took any for one day and she is wondering if she could go back on what she use to be on (novac???) because the one is on now makes he feel sick. She is wanting to know if this could be done today Her call back number is [phone] Fax number is [facility] is [phone] This is the [facility] living that she lives at

## 2013-05-06 NOTE — Telephone Encounter (Signed)
Dr Merla Riches wants her to d/c meds for hypertension because of her dizziness. Faxed order yesterday. Called left message for [facility] to call me back.

## 2013-05-07 ENCOUNTER — Telehealth: Payer: Self-pay

## 2013-05-07 NOTE — Telephone Encounter (Signed)
PATIENT WANTS DR. Merla Riches TO GET THIS MESSAGE AS SOON AS POSSIBLE. SHE TOLD DR. DOOLITTLE BY MISTAKE THAT ALVATONE (BLOOD PRESSURE) MEDICATION MAKES HER FEEL DIZZY. THAT WAS NOT CORRECT. SHE SAID IT MAKES HER TIRED. SHE WOULD LIKE TO BE TAKEN OFF OF THIS AND PUT BACK ON NOVASC 2.5MG . SHE NEEDS TO HAVE THIS FAXED TO [facility] WHICH IS WHERE SHE LIVES. BEST PHONE [phone] (TELL WHO ANSWERS THE PHONE THAT YOU NEED TO SPEAK WITH Dezaray Hovanec) FAX # [phone]  ([facility])    MBC

## 2013-05-07 NOTE — Telephone Encounter (Signed)
Call and tell her The current plan is to have her stop the aldactone and have BOP checked for a few days and then we can restart norvasc once we know that she clearly needs it

## 2013-05-07 NOTE — Telephone Encounter (Signed)
Called, answering machine picked up. Will try to call back later.

## 2013-05-10 ENCOUNTER — Telehealth: Payer: Self-pay

## 2013-05-10 NOTE — Telephone Encounter (Signed)
PT STATES SHE WOULD LIKE TO BE PUT BACK ON THE OTHER 2 PILLS SHE HAD BEFORE FROM DR DOOLITTLE. ONE WAS THE NORVASC AND SHE DOESN'T REMEMBER THE OTHER ONE PLEASE CALL [phone]   HARRIS TEETER AT [facility] AT [phone]

## 2013-05-11 NOTE — Telephone Encounter (Signed)
Left message with the front desk- she does not have a phone in her room. She will have to call us back.

## 2013-05-11 NOTE — Telephone Encounter (Signed)
Pt refuses to take BP. She says it stresses her out. Advised that we needed the BP readings to find out if she could restarted medication. Pt began yelling into the phone stated that we are freaking her out. She is going to vomit she is so nervous. After apologizing to her and telling her we just needed her blood pressure to be taken each day she stated she understood that she is not to restart her BP medication until we are able to get some readings. She stated she is fine. She started yelling again. She does not need the medication and does not need to talk to me. She hung up the line.

## 2013-05-11 NOTE — Telephone Encounter (Signed)
Per telephone note dated 05/07/13 - pt to remain off meds and check BPs, will restart meds if there is a clear need

## 2013-05-11 NOTE — Telephone Encounter (Signed)
Pt stopped Aldactone. Pt is going to refuse taking her BP. She works out. She states she is fine now.

## 2013-05-27 ENCOUNTER — Telehealth: Payer: Self-pay

## 2013-05-27 NOTE — Telephone Encounter (Signed)
Patient advised multiple times to d/c the BP meds, she refuses them because they make her dizzy.

## 2013-05-27 NOTE — Telephone Encounter (Signed)
Molli Hazard, pharmacist at [facility], called to check on BP meds. He has not filled anything for pt in 3 years. Advised him that pt is not supposed to be taking any BP meds d/t SEs until she sees Dr Merla Riches on 06/01/13. Pharm stated he will call the pt and let her know.

## 2013-05-27 NOTE — Telephone Encounter (Signed)
Pt is calling to check the status of her blood pressure medication, she states that she requested them on Tuesday. The two medications that she is needing spironolactone (ALDACTONE) 25 MG tablet and Norvasc 2.5mg . Bets#[phone] Pharmacy: [facility]

## 2013-05-28 ENCOUNTER — Telehealth: Payer: Self-pay

## 2013-05-28 NOTE — Telephone Encounter (Signed)
Pt calling about BP meds. Is very determined to have the prescription faxed to the pharmacy asap. Please call [phone].

## 2013-05-29 NOTE — Telephone Encounter (Signed)
Time for follow up.  Haven't seen her in over 6 months.  No blood pressure medicine needed unless it has gone up since last visit.

## 2013-05-29 NOTE — Telephone Encounter (Signed)
Called pt, phone is disconnected.

## 2013-05-29 NOTE — Telephone Encounter (Signed)
Dr Milus Glazier is she supposed to be on this medication or not? I think she is forgetting and she calls about this every week. Please advise

## 2013-05-30 ENCOUNTER — Telehealth: Payer: Self-pay

## 2013-05-30 NOTE — Telephone Encounter (Signed)
Facility is aware patients HTN meds have been discontinued. Patient continues to want HTN meds sent in, continues to call. She has been advised she requested these be discontinued because of dizziness. I have advised multiple times, unfortunately she does not remember, to you FYI patient confused, continues to call daily about her medications, she does have upcoming appointment with you.

## 2013-05-30 NOTE — Telephone Encounter (Signed)
Tell her we will discuss at appt---(she needs lab recheck re confusion)

## 2013-05-30 NOTE — Telephone Encounter (Signed)
Dr. Merla Riches,  Patient called from [facility] and wants you to know that she needs her medications faxed over to her as soon as possible.  Please advise Fax # [phone]

## 2013-05-30 NOTE — Telephone Encounter (Signed)
We have had communication with the facility where she is staying, she is not to take BP meds because of her continued dizziness.

## 2013-05-31 NOTE — Telephone Encounter (Signed)
Thanks. Have advised.

## 2013-06-01 ENCOUNTER — Telehealth: Payer: Self-pay

## 2013-06-01 ENCOUNTER — Ambulatory Visit: Payer: Medicare Other | Admitting: Internal Medicine

## 2013-06-01 NOTE — Telephone Encounter (Signed)
Gina Zavala has called again today for refills of her htn meds. See previous message from 9/8. Possibly we could notify someone at the retirement facility that she is no longer taking these meds as she does not recall any phonecall from [place] about this.  Pt [phone]

## 2013-06-01 NOTE — Telephone Encounter (Signed)
Have advised facility. They are aware. Pharmacy is aware, patient calls whenever /wherever she can find a phone. Facility can not stop her from calling, but they are aware. To Dr Anders Grant

## 2013-06-02 ENCOUNTER — Telehealth: Payer: Self-pay | Admitting: Radiology

## 2013-06-02 MED ORDER — AMLODIPINE BESYLATE 2.5 MG PO TABS: 2.5000 mg | ORAL_TABLET | Freq: Every day | ORAL | Status: DC

## 2013-06-02 NOTE — Telephone Encounter (Signed)
06/02/2013 - AMY - CAN THIS ENCOUNTER BE CLEARED OR IS IT STILL OPEN?  THANKS, MBC °

## 2013-06-02 NOTE — Telephone Encounter (Signed)
This was sent in called patient again

## 2013-06-02 NOTE — Telephone Encounter (Signed)
Yes

## 2013-06-02 NOTE — Telephone Encounter (Signed)
Phone call again today with patient she is very tearful. She states she will not eat until you put her back on her meds for her blood pressure, she states she needs it ,she is very fixated and does not remember ever being told to stop the BP meds, even though I have spoken to her multiple times in past couple weeks.

## 2013-06-02 NOTE — Telephone Encounter (Signed)
Pt has called requesting dr Merla Riches to refill BP med Norvasc 2.5 mg. Pt states the meds do not make her dizzy, states "she lied earlier to get rx changed" and wants to be back on the Norvasc BP med

## 2013-06-02 NOTE — Telephone Encounter (Signed)
Ok, have sent in to the pharmacy. Gina Zavala. Called patient to advise, but she did not speak, will call back.

## 2013-06-02 NOTE — Telephone Encounter (Signed)
Ok to restart norvasc and check BP once a day

## 2013-06-09 ENCOUNTER — Telehealth: Payer: Self-pay | Admitting: Radiology

## 2013-06-09 NOTE — Telephone Encounter (Signed)
Have gotten faxes from [facility] concerning her increased confusion and ? If a UA can be done. Dr Merla Riches would like her to come in for this. nurse tech Cylah will arrange for her to come in.

## 2013-06-29 ENCOUNTER — Ambulatory Visit: Payer: Medicare Other | Admitting: Internal Medicine

## 2013-07-20 ENCOUNTER — Encounter: Payer: Self-pay | Admitting: Internal Medicine

## 2013-08-10 ENCOUNTER — Ambulatory Visit (INDEPENDENT_AMBULATORY_CARE_PROVIDER_SITE_OTHER): Payer: Medicare Other | Admitting: Internal Medicine

## 2013-08-10 VITALS — BP 155/89 | Temp 98.0°F | Resp 18 | Wt 75.4 lb

## 2013-08-10 DIAGNOSIS — M8580 Other specified disorders of bone density and structure, unspecified site: Secondary | ICD-10-CM

## 2013-08-10 DIAGNOSIS — M810 Age-related osteoporosis without current pathological fracture: Secondary | ICD-10-CM

## 2013-08-10 DIAGNOSIS — I10 Essential (primary) hypertension: Secondary | ICD-10-CM

## 2013-08-10 DIAGNOSIS — E871 Hypo-osmolality and hyponatremia: Secondary | ICD-10-CM

## 2013-08-10 DIAGNOSIS — Z23 Encounter for immunization: Secondary | ICD-10-CM

## 2013-08-10 LAB — CBC WITH DIFFERENTIAL/PLATELET
Basophils Absolute: 0 10*3/uL (ref 0.0–0.1)
Basophils Relative: 0 % (ref 0–1)
Eosinophils Relative: 0 % (ref 0–5)
HCT: 42 % (ref 36.0–46.0)
Hemoglobin: 14.6 g/dL (ref 12.0–15.0)
Lymphocytes Relative: 15 % (ref 12–46)
Lymphs Abs: 0.8 10*3/uL (ref 0.7–4.0)
MCV: 90.1 fL (ref 78.0–100.0)
Monocytes Absolute: 0.4 10*3/uL (ref 0.1–1.0)
Monocytes Relative: 8 % (ref 3–12)
Neutro Abs: 4.2 10*3/uL (ref 1.7–7.7)
RDW: 13.4 % (ref 11.5–15.5)
WBC: 5.5 10*3/uL (ref 4.0–10.5)

## 2013-08-10 MED ORDER — AMLODIPINE BESYLATE 5 MG PO TABS: 5.0000 mg | ORAL_TABLET | Freq: Every day | ORAL | Status: DC

## 2013-08-10 NOTE — Patient Instructions (Signed)
OK to discontinue aredes eye drops May use up to 325 mgx2 every 6-8 hr if needed for back pain Increase norvasc to 5 mg

## 2013-08-10 NOTE — Progress Notes (Addendum)
Subjective:    Patient ID: Gina Zavala, female    DOB: 18-Jul-1922, 79 y.o.   MRN: [id number]  HPI Patient presents today for follow up of HTN.   Patent wants to go home; is unhappy with her living situation. Lives at [facility] in assisted living. Does not have any control over her OTC medications, this causes her frustration. Wants to move back to her own home. Realizes that she will need help.   Discontinued norvasc due to heartburn. This is exacerbated by caffeine. Drinks green tea now.   No falls but continues at risk due to back problems and age so is restricted to walker//no c/o back pain or GU symptom increase  Flu vaccine- today  Review of Systems  Constitutional: Negative for activity change, appetite change, fatigue and unexpected weight change.  HENT: Negative for trouble swallowing and voice change.   Eyes:       Glasses  Respiratory: Negative for shortness of breath.   Cardiovascular: Negative for chest pain, palpitations and leg swelling.  Gastrointestinal: Negative for abdominal pain.  Genitourinary:       See history  Neurological: Negative for dizziness and headaches.  Psychiatric/Behavioral:       Reports agitation/frustration/anxiety over living situation No paranoia but beginning to think some workers are hostile toward her Not depressed Sleep ok        Objective:   Physical Exam  Nursing note and vitals reviewed. Constitutional: She is oriented to person, place, and time. No distress.  Thin female.  Eyes: Conjunctivae are normal. Right eye exhibits no discharge. Left eye exhibits no discharge.  Neck: No thyromegaly present.  Cardiovascular: Normal rate, regular rhythm, normal heart sounds and intact distal pulses.   Soft systolic M ULSB and URSB w/out rad 1-2/6  Pulmonary/Chest: Effort normal and breath sounds normal.  Musculoskeletal: She exhibits no edema.  Back deformity as in past Needs walker to ambulate  Neurological: She is alert and  oriented to person, place, and time. No cranial nerve deficit.  Some confusion.  Skin: Skin is warm and dry. She is not diaphoretic.  Psychiatric: She has a normal mood and affect. Her behavior is normal.  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 Incr amlodipine  Fr 2.5 to 5 mg po qd.   Hyponatremia - Plan: CBC with Differential, Comprehensive metabolic panel, TSH, Osmolality  ?role in mental change  Osteopenia/porosis - Plan: Vit D  25 hydroxy (rtn osteoporosis monitoring)   Meds ordered this encounter  Medications  . amLODipine (NORVASC) 5 MG tablet    Sig: Take 1 tablet (5 mg total) by mouth daily.    Dispense:  90 tablet    Refill:  3   I have completed the patient encounter in its entirety as documented by FNP Leone Payor, with editing by me where necessary. Robert P. Merla Riches, M.D.   Adden+  Results for orders placed in visit on 08/10/13  CBC WITH DIFFERENTIAL      Result Value Range   WBC 5.5  4.0 - 10.5 K/uL   RBC 4.66  3.87 - 5.11 MIL/uL   Hemoglobin 14.6  12.0 - 15.0 g/dL   HCT 40.9  81.1 - 91.4 %   MCV 90.1  78.0 - 100.0 fL   MCH 31.3  26.0 - 34.0 pg   MCHC 34.8  30.0 - 36.0 g/dL   RDW 78.2  95.6 - 21.3 %   Platelets 455 (*) 150 - 400 K/uL   Neutrophils Relative % 77  43 - 77 %   Neutro Abs 4.2  1.7 - 7.7  K/uL   Lymphocytes Relative 15  12 - 46 %   Lymphs Abs 0.8  0.7 - 4.0 K/uL   Monocytes Relative 8  3 - 12 %   Monocytes Absolute 0.4  0.1 - 1.0 K/uL   Eosinophils Relative 0  0 - 5 %   Eosinophils Absolute 0.0  0.0 - 0.7 K/uL   Basophils Relative 0  0 - 1 %   Basophils Absolute 0.0  0.0 - 0.1 K/uL   Smear Review Criteria for review not met    COMPREHENSIVE METABOLIC PANEL      Result Value Range   Sodium 126 (*) 135 - 145 mEq/L   Potassium 4.1  3.5 - 5.3 mEq/L   Chloride 88 (*) 96 - 112 mEq/L   CO2 32  19 - 32 mEq/L   Glucose, Bld 85  70 - 99 mg/dL   BUN 16  6 - 23 mg/dL   Creat 0.86 (*) 5.78 - 1.10 mg/dL   Total Bilirubin 0.5  0.3 - 1.2 mg/dL   Alkaline Phosphatase 115  39 - 117 U/L   AST 20  0 - 37 U/L   ALT 16  0 - 35 U/L   Total Protein 6.8  6.0 - 8.3 g/dL   Albumin 4.2  3.5 - 5.2 g/dL   Calcium 9.9  8.4 - 46.9 mg/dL  TSH      Result Value Range   TSH 2.577  0.350 - 4.500 uIU/mL  OSMOLALITY      Result Value Range   Osmolality 273 (*) 275 - 300 mOsm/kg  VITAMIN D 25 HYDROXY      Result Value Range   Vit D, 25-Hydroxy 21 (*) 30 - 89 ng/mL   To: [facility]  Have them add Vit D 2000 units daily

## 2013-08-11 LAB — COMPREHENSIVE METABOLIC PANEL
ALT: 16 U/L (ref 0–35)
Albumin: 4.2 g/dL (ref 3.5–5.2)
BUN: 16 mg/dL (ref 6–23)
CO2: 32 mEq/L (ref 19–32)
Calcium: 9.9 mg/dL (ref 8.4–10.5)
Chloride: 88 mEq/L — ABNORMAL LOW (ref 96–112)
Creat: 0.46 mg/dL — ABNORMAL LOW (ref 0.50–1.10)
Glucose, Bld: 85 mg/dL (ref 70–99)
Potassium: 4.1 mEq/L (ref 3.5–5.3)
Sodium: 126 mEq/L — ABNORMAL LOW (ref 135–145)

## 2013-08-11 LAB — VITAMIN D 25 HYDROXY (VIT D DEFICIENCY, FRACTURES): Vit D, 25-Hydroxy: 21 ng/mL — ABNORMAL LOW (ref 30–89)

## 2013-08-11 LAB — TSH: TSH: 2.577 u[IU]/mL (ref 0.350–4.500)

## 2013-08-11 LAB — OSMOLALITY: Osmolality: 273 mOsm/kg — ABNORMAL LOW (ref 275–300)

## 2013-08-15 ENCOUNTER — Encounter: Payer: Self-pay | Admitting: Internal Medicine

## 2013-09-07 ENCOUNTER — Telehealth: Payer: Self-pay | Admitting: Family Medicine

## 2013-09-07 NOTE — Telephone Encounter (Signed)
Called Nik Laws to let him know that paperwork was ready for pick up at [address].

## 2013-11-16 ENCOUNTER — Ambulatory Visit: Payer: Medicare Other | Admitting: Internal Medicine

## 2014-03-23 ENCOUNTER — Encounter: Payer: Self-pay | Admitting: Internal Medicine

## 2014-03-30 ENCOUNTER — Encounter: Payer: Self-pay | Admitting: Internal Medicine

## 2014-06-07 ENCOUNTER — Ambulatory Visit (INDEPENDENT_AMBULATORY_CARE_PROVIDER_SITE_OTHER): Payer: Medicare Other | Admitting: *Deleted

## 2014-06-07 DIAGNOSIS — Z23 Encounter for immunization: Secondary | ICD-10-CM

## 2014-06-13 ENCOUNTER — Telehealth: Payer: Self-pay

## 2014-06-13 NOTE — Telephone Encounter (Signed)
Pt has not had lab work done since last year. I tried rtn pt call and home number is disconnected. Called mobile number and it is a number for [facility]- transferred to pt room.  Spoke to pt- she is going to call for an appt in the near future. She needs to have her lab work completed. She is concerned about her Vitamin D.

## 2014-06-13 NOTE — Telephone Encounter (Signed)
DOOLITTLE - Pt said she had labs done about a month ago.  She would like a copy of the results mailed to her and please mark them confidential.

## 2014-08-24 ENCOUNTER — Telehealth: Payer: Self-pay

## 2014-08-24 NOTE — Telephone Encounter (Signed)
Carmella from [facility] dropped off paperwork to be signed by Dr. Merla Richesoolittle. Placed in Dr. Netta Corriganoolittle's box.

## 2014-08-28 NOTE — Telephone Encounter (Signed)
Needs appt 1st-no ov 1 year

## 2014-08-30 NOTE — Telephone Encounter (Signed)
LM for rtn call. 

## 2014-09-01 ENCOUNTER — Ambulatory Visit (INDEPENDENT_AMBULATORY_CARE_PROVIDER_SITE_OTHER): Payer: Medicare Other | Admitting: Internal Medicine

## 2014-09-01 DIAGNOSIS — I10 Essential (primary) hypertension: Secondary | ICD-10-CM

## 2014-09-01 DIAGNOSIS — M810 Age-related osteoporosis without current pathological fracture: Secondary | ICD-10-CM

## 2014-09-01 DIAGNOSIS — E871 Hypo-osmolality and hyponatremia: Secondary | ICD-10-CM

## 2014-09-01 DIAGNOSIS — Z9181 History of falling: Secondary | ICD-10-CM | POA: Insufficient documentation

## 2014-09-01 DIAGNOSIS — M419 Scoliosis, unspecified: Secondary | ICD-10-CM

## 2014-09-01 NOTE — Progress Notes (Signed)
Subjective:  This chart was scribed for Gina Siaobert Amy Gothard, MD by Jarvis Morganaylor Ferguson, Medical Scribe. This patient was seen in Room 14 and the patient's care was started at 11:18 AM.   Patient ID: Gina Zavala, female    DOB: 08/23/1922, 80 y.o.   MRN: [id number]  Chief Complaint  Patient presents with  . Forms    need filled out for medicaid    HPI  HPI Comments: Gina ChangMary E Zavala is a 80 y.o. female who presents to the [facility] for forms that need to be filled out for her Medicaid insurance. Pt is wanting to leave the facility she is currently living at and wants to go live back at her home. Pt does not like the facility where she lives, but she is not capable of living independently. She has musculoskeletal instability related to her osteoporosis with spinal deformities .She denies any other issues at this time. Blood pressures at the living facility have been acceptable. She continues on 5 mg of amlodipine.  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  Patient Active Problem List   Diagnosis Date Noted  . Carotid bruit 01/14/2012  . Osteoporosis 01/14/2012  . HTN (hypertension) 09/01/2011  . Scoliosis 09/01/2011  . Hyponatremia 09/01/2011  . Uterine prolapse 09/01/2011    -  at risk for falling History reviewed. No pertinent past medical history. History reviewed. No pertinent past surgical history. No Known Allergies Prior to Admission medications   Medication Sig Start Date End Date Taking? Authorizing Provider  amLODipine (NORVASC) 5 MG tablet Take 1 tablet (5 mg total) by mouth daily. 08/10/13  Yes Tonye Pearsonobert P Gage Treiber, MD  Bismuth Subsalicylate 262 MG TABS Take 1 tablet (262 mg total) by mouth 4 (four) times daily as needed. 12/19/11  Yes Ryan M Dunn, PA-C  dextran 70-hypromellose (TEARS RENEWED) ophthalmic solution Place 1 drop into both eyes 4 (four) times daily as needed. 04/18/13  Yes Tonye Pearsonobert P Averlee Swartz, MD    fluticasone Aleda Grana(FLONASE) 50 MCG/ACT nasal spray  03/16/13  Yes Historical Provider, MD  Polyethyl Glycol-Propyl Glycol (SYSTANE OP) Apply to eye.   Yes Historical Provider, MD  spironolactone (ALDACTONE) 25 MG tablet Take 1 tablet (25 mg total) by mouth daily. 03/23/13  there is a question about whether she takes this medication  Yes Tonye Pearsonobert P Jalysa Swopes, MD   History   Social History  . Marital Status: Married    Spouse Name: N/A    Number of Children: N/A  . Years of Education: N/A   Occupational History  . Not on file.   Social History Main Topics  . Smoking status: Former Smoker -- 0.30 packs/day for 1 years    Types: Cigarettes    Quit date: 01/13/1949  . Smokeless tobacco: Not on file  . Alcohol Use: Not on file  . Drug Use: Not on file  . Sexual Activity: Not on file   Other Topics Concern  . Not on file   Social History Narrative    Review of Systems  Constitutional: Negative for activity change, appetite change, fatigue and unexpected weight change.  HENT: Negative for hearing loss.   Eyes: Negative for visual disturbance.  Respiratory: Negative for shortness of breath.   Cardiovascular: Negative for chest pain, palpitations and leg swelling.  Gastrointestinal: Negative for abdominal pain.  Musculoskeletal: Positive for back pain.  Neurological: Negative for dizziness, weakness and light-headedness.  Psychiatric/Behavioral: Negative for confusion and agitation.  Objective:   Physical Exam  Constitutional: She is oriented to person, place, and time. She appears well-developed and well-nourished. No distress.  HENT:  Head: Normocephalic and atraumatic.  She is beginning to depend more on having you face her and speak directly to her if she is going to understand your comments  Eyes: Conjunctivae and EOM are normal. Pupils are equal, round, and reactive to light.  Neck: Neck supple.  Cardiovascular: Normal rate and regular rhythm.   Pulmonary/Chest: Effort  normal.  Musculoskeletal: She exhibits no edema.  Exaggerated kyphosis  Neurological: She is alert and oriented to person, place, and time. No cranial nerve deficit.  She remains amazingly agile from a cognitive standpoint Her gait is stable today without a walker  Skin: Skin is warm and dry.  Psychiatric: She has a normal mood and affect. Her behavior is normal.  Nursing note and vitals reviewed. There were no vitals filed for this visit. she refused vital signs as usual      Assessment & Plan:  At risk for falling  Essential hypertension  Scoliosis  Hyponatremia  Osteoporosis  No change in medications . Interesting dilemma about whether she should be in a care facility or whether she would be happier if they would arrange in-home help and allow her to live outside of the nursing facility She needs to follow-up as planned 09/20/2014 for laboratory evaluation and physical checkup--we will do a  mental status exam at that point///hearing screening//? Vision  Records requested from Dr. Hazle Quantigby about recent eye checkup    I personally performed the services described in this documentation, which was scribed in my presence. The recorded information has been reviewed and is accurate.I have completed the patient encounter in its entirety as documented by the scribe, with editing by me where necessary. Bibiana Gillean P. Merla Richesoolittle, M.D.

## 2014-09-06 ENCOUNTER — Ambulatory Visit: Payer: Medicare Other | Admitting: Internal Medicine

## 2014-09-20 ENCOUNTER — Ambulatory Visit (INDEPENDENT_AMBULATORY_CARE_PROVIDER_SITE_OTHER): Payer: Medicare Other | Admitting: Internal Medicine

## 2014-09-20 ENCOUNTER — Encounter: Payer: Self-pay | Admitting: Internal Medicine

## 2014-09-20 VITALS — BP 160/88 | HR 84 | Temp 98.0°F | Resp 18

## 2014-09-20 DIAGNOSIS — I10 Essential (primary) hypertension: Secondary | ICD-10-CM

## 2014-09-20 DIAGNOSIS — Z9181 History of falling: Secondary | ICD-10-CM

## 2014-09-20 DIAGNOSIS — E871 Hypo-osmolality and hyponatremia: Secondary | ICD-10-CM

## 2014-09-20 DIAGNOSIS — E785 Hyperlipidemia, unspecified: Secondary | ICD-10-CM

## 2014-09-20 DIAGNOSIS — M419 Scoliosis, unspecified: Secondary | ICD-10-CM

## 2014-09-20 DIAGNOSIS — M810 Age-related osteoporosis without current pathological fracture: Secondary | ICD-10-CM

## 2014-09-20 LAB — COMPREHENSIVE METABOLIC PANEL
ALBUMIN: 4.3 g/dL (ref 3.5–5.2)
ALK PHOS: 117 U/L (ref 39–117)
ALT: 10 U/L (ref 0–35)
AST: 17 U/L (ref 0–37)
BUN: 17 mg/dL (ref 6–23)
CO2: 30 meq/L (ref 19–32)
Calcium: 9.9 mg/dL (ref 8.4–10.5)
Chloride: 89 mEq/L — ABNORMAL LOW (ref 96–112)
Creat: 0.55 mg/dL (ref 0.50–1.10)
Glucose, Bld: 92 mg/dL (ref 70–99)
Potassium: 5.1 mEq/L (ref 3.5–5.3)
SODIUM: 129 meq/L — AB (ref 135–145)
Total Bilirubin: 0.4 mg/dL (ref 0.2–1.2)
Total Protein: 6.8 g/dL (ref 6.0–8.3)

## 2014-09-20 LAB — LIPID PANEL
CHOL/HDL RATIO: 2.5 ratio
CHOLESTEROL: 204 mg/dL — AB (ref 0–200)
HDL: 82 mg/dL (ref >39)
LDL CALC: 104 mg/dL — AB (ref 0–99)
TRIGLYCERIDES: 91 mg/dL (ref <150)
VLDL: 18 mg/dL (ref 0–40)

## 2014-09-20 LAB — CBC WITH DIFFERENTIAL/PLATELET
BASOS ABS: 0 10*3/uL (ref 0.0–0.1)
Basophils Relative: 1 % (ref 0–1)
Eosinophils Absolute: 0 10*3/uL (ref 0.0–0.7)
Eosinophils Relative: 1 % (ref 0–5)
HEMATOCRIT: 41.8 % (ref 36.0–46.0)
HEMOGLOBIN: 13.9 g/dL (ref 12.0–15.0)
LYMPHS PCT: 24 % (ref 12–46)
Lymphs Abs: 1.2 10*3/uL (ref 0.7–4.0)
MCH: 30.5 pg (ref 26.0–34.0)
MCHC: 33.3 g/dL (ref 30.0–36.0)
MCV: 91.7 fL (ref 78.0–100.0)
MONOS PCT: 8 % (ref 3–12)
MPV: 9 fL (ref 8.6–12.4)
Monocytes Absolute: 0.4 10*3/uL (ref 0.1–1.0)
NEUTROS ABS: 3.2 10*3/uL (ref 1.7–7.7)
Neutrophils Relative %: 66 % (ref 43–77)
Platelets: 388 10*3/uL (ref 150–400)
RBC: 4.56 MIL/uL (ref 3.87–5.11)
RDW: 13.2 % (ref 11.5–15.5)
WBC: 4.9 10*3/uL (ref 4.0–10.5)

## 2014-09-20 MED ORDER — AMLODIPINE BESYLATE 5 MG PO TABS: 5.0000 mg | ORAL_TABLET | Freq: Every day | ORAL | Status: DC

## 2014-09-20 NOTE — Progress Notes (Signed)
   Subjective:    Patient ID: Gina Zavala, female    DOB: 10/09/1921, 80 y.o.   MRN: [id number]  HPIf/u Patient Active Problem List   Diagnosis Date Noted  . At risk for falling 09/01/2014  . Carotid bruit 01/14/2012  . Osteoporosis 01/14/2012  . HTN (hypertension) 09/01/2011  . Scoliosis 09/01/2011  . Hyponatremia 09/01/2011  . Uterine prolapse 09/01/2011   She continues living in a nursing facility where she is very unhappy. Her roommate has dementia and incontinence and is difficult to get along with. She continues to argue that she does not need this level of care and wants to go home but her daughter and son-in-law are reluctant to let this happen. There is a history of being at risk for falling based on her age, osteoporosis with kyphosis, episodes of hypotension suspected but never proven. She was initially put an assisted living after a fall during the middle the night from which she could not get up, but she has not had another fall in the nursing facility over these last 3 years. 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 Continues to use walker for support. Continues to be fearful of living people check her blood pressure or do blood test thinking that something may be discovered that is wrong.   Supplements: Canola oilo Flax seed calcium  Asking for bone scan to check on osteopenia/porosis--last scan ~3y ago(scanned)confirmed osteoporosis   Review of Systems No chst pain or palp No episodes of syncope or dizziness No GI or GU symptoms despite history of prolapse No new bone or joint pain    Objective:   Physical Exam BP-155/80----160/88 p-75--regular Alert and talkative Significant kyphosis obvious PERRLA and EOMs conjugate No murmur or gallop--occasional premature beats Lungs clear No peripheral edema Mood is good/affect appropriate/thought content normal/judgment appears sound Hearing is  somewhat diminished There is obvious weakness rising from the chair and gait is accomplished with the use of a walker     Assessment & Plan:  Hyponatremia -last labs were one year ago and she agrees to recheck  Plan: Osmolality,CMET  Essential hypertension - Plan: CBC with Differential, Comprehensive metabolic panel  Osteoporosis - Plan: DG Bone Density  Scoliosis  At risk for falling--this is the main risk for her with independent living and were she to arrange for living companion at home she would not need more significant skilled labor at this point///this is apparently under discussion with her family  Hyperlipidemia - she is interested to see if her supplements and diet have corrected this-- Lipid panel  Meds ordered this encounter  Medications  . amLODipine (NORVASC) 5 MG tablet    Sig: Take 1 tablet (5 mg total) by mouth daily.    Dispense:  90 tablet    Refill:  3   Follow-up one month for recheck I urged her to allow frequent blood pressure checks in the nursing facility Specifically we need to look for episodes of hypotension related to her medication if there is a symptom of dizziness

## 2014-09-21 ENCOUNTER — Encounter: Payer: Self-pay | Admitting: Internal Medicine

## 2014-09-21 LAB — OSMOLALITY: OSMOLALITY: 268 mosm/kg — AB (ref 275–300)

## 2014-09-27 ENCOUNTER — Encounter: Payer: Self-pay | Admitting: Internal Medicine

## 2014-10-02 ENCOUNTER — Encounter: Payer: Self-pay | Admitting: Internal Medicine

## 2014-10-24 DIAGNOSIS — N8111 Cystocele, midline: Secondary | ICD-10-CM | POA: Diagnosis not present

## 2014-10-25 ENCOUNTER — Encounter: Payer: Self-pay | Admitting: Internal Medicine

## 2014-10-25 ENCOUNTER — Ambulatory Visit (INDEPENDENT_AMBULATORY_CARE_PROVIDER_SITE_OTHER): Payer: Medicare Other | Admitting: Internal Medicine

## 2014-10-25 VITALS — Temp 97.6°F | Resp 16 | Ht <= 58 in | Wt 72.0 lb

## 2014-10-25 DIAGNOSIS — I10 Essential (primary) hypertension: Secondary | ICD-10-CM

## 2014-10-25 DIAGNOSIS — E871 Hypo-osmolality and hyponatremia: Secondary | ICD-10-CM | POA: Diagnosis not present

## 2014-10-25 NOTE — Patient Instructions (Signed)
Recent labs are great!!! Health appears to be stable. No changes in meds.

## 2014-10-25 NOTE — Progress Notes (Signed)
F/u  Patient Active Problem List   Diagnosis Date Noted  . At risk for falling 09/01/2014  . Carotid bruit 01/14/2012  . Osteoporosis 01/14/2012  . HTN (hypertension) 09/01/2011  . Scoliosis 09/01/2011  . Hyponatremia 09/01/2011  . Uterine prolapse 09/01/2011   See labs 09/21/14---really good!!! Stable hyponatr She uses 2 eggwhites/tomatoes and grapefruit to help cholesterol  Slipped disc w/sciatic pain Wilson be evaluated by orthopedics Walker due to spine dz--? Balance--she has had no recent falls  Walked 1 mile in 66 minutes  Dr Ambrose MantleHenley yesterday pessary should remain in place! It is successful in preventing prolapse.  She is planning to move from her nursing facility back home with some extra home health care. She can do her activities of daily living for the most part but needs help with locomotion issues and housecleaning and other things that are prevented by her significant spinal disease. She is very excited about this and has the support of her daughter once again. Is significant that she is so alert mentally that she doesn't fit in her current nursing facility and needs an environment with much more stimulation much more freedom of activity. She also has had a series of terrible roommates who needed her to care for them as part of being in the nursing facility.  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 Hazle Quantigby  Exam Pulse   Temp(Src) 97.6 F (36.4 C) (Oral)  Resp 16  Ht 4\' 9"  (1.448 m)  Wt 72 lb (32.659 kg)  BMI 15.58 kg/m2 She refuses blood pressure monitoring at this visit which is not unusual for her Carotid bruits stable Heart regular without murmur rub or gallop Lungs clear Good peripheral pulses with no edema Cranial nerves II through XII intact Alert and oriented to time person and place Thought content normal/judgment sound Mood is terrifically good  Impression Essential  hypertension  Hyponatremia  Risk of fall secondary to degenerative scoliosis-? Herniation with left-sided symptoms  Plan Continue low-dose Norvasc I support her decision to move from the nursing facility Follow-up 3 months

## 2014-11-22 DIAGNOSIS — H524 Presbyopia: Secondary | ICD-10-CM | POA: Diagnosis not present

## 2014-11-22 DIAGNOSIS — H04123 Dry eye syndrome of bilateral lacrimal glands: Secondary | ICD-10-CM | POA: Diagnosis not present

## 2014-11-22 DIAGNOSIS — H3531 Nonexudative age-related macular degeneration: Secondary | ICD-10-CM | POA: Diagnosis not present

## 2014-11-27 ENCOUNTER — Telehealth: Payer: Self-pay

## 2014-11-27 NOTE — Telephone Encounter (Signed)
I put in a fax back box

## 2014-11-27 NOTE — Telephone Encounter (Signed)
Caregiver at [facility] sent fax (and called) concerning direction clarification for pt's lubricating eye drops. She stated at last OV Dr Merla Richesoolittle had changed directions for using artificial tears as needed to applying them regularly, but they do not have orders stating how many drops, how many times per day. I have put fax in Dr Doolittle's box to write inst's and fax back.

## 2014-11-28 NOTE — Telephone Encounter (Signed)
Filled Dr Doolittle's directions in on form and [facility]. Scanned.

## 2014-11-28 NOTE — Telephone Encounter (Signed)
Dr Merla Richesoolittle, I'm sorry but I don't see that you filled in directions for use of pt's eye drops. They are wanting to know how many drops you want them to administer and how many times per day. One prev Rx was for BID and one was for QID. Do you want them to just use 1 drop ea eye?

## 2014-11-28 NOTE — Telephone Encounter (Signed)
My mistake 1 drop twice a day

## 2014-12-06 DIAGNOSIS — B070 Plantar wart: Secondary | ICD-10-CM | POA: Diagnosis not present

## 2014-12-06 DIAGNOSIS — L851 Acquired keratosis [keratoderma] palmaris et plantaris: Secondary | ICD-10-CM | POA: Diagnosis not present

## 2014-12-06 DIAGNOSIS — B351 Tinea unguium: Secondary | ICD-10-CM | POA: Diagnosis not present

## 2014-12-06 DIAGNOSIS — M79673 Pain in unspecified foot: Secondary | ICD-10-CM | POA: Diagnosis not present

## 2014-12-11 ENCOUNTER — Telehealth: Payer: Self-pay | Admitting: Internal Medicine

## 2014-12-11 NOTE — Telephone Encounter (Signed)
FYI

## 2014-12-11 NOTE — Telephone Encounter (Signed)
Patient request for Dr. Merla Richesoolittle to complete Physician's Diet Order. Please call patient's assistant Carmella when forms are ready. I will place the forms in the nurse's box at 102. Thanks.

## 2014-12-13 NOTE — Telephone Encounter (Signed)
In your box

## 2014-12-19 NOTE — Telephone Encounter (Signed)
Left a message on Gina Zavala's machine that pt's forms were ready to be picked up. It is in a long brown folder in the pick up draw.

## 2015-01-09 ENCOUNTER — Telehealth: Payer: Self-pay

## 2015-01-09 NOTE — Telephone Encounter (Signed)
Doolittle-form in your box.

## 2015-01-09 NOTE — Telephone Encounter (Signed)
Patients caregiver Malachi ParadiseCarmella dropped off a form for Dr Merla Richesoolittle to fill out. Please call Carmella at [phone] when ready to be picked up.

## 2015-01-24 ENCOUNTER — Ambulatory Visit (INDEPENDENT_AMBULATORY_CARE_PROVIDER_SITE_OTHER): Payer: Medicare Other | Admitting: Internal Medicine

## 2015-01-24 ENCOUNTER — Encounter: Payer: Self-pay | Admitting: Internal Medicine

## 2015-01-24 VITALS — HR 80 | Temp 98.4°F | Resp 16 | Ht <= 58 in | Wt 73.0 lb

## 2015-01-24 DIAGNOSIS — I10 Essential (primary) hypertension: Secondary | ICD-10-CM | POA: Diagnosis not present

## 2015-01-24 DIAGNOSIS — M810 Age-related osteoporosis without current pathological fracture: Secondary | ICD-10-CM

## 2015-01-24 DIAGNOSIS — E871 Hypo-osmolality and hyponatremia: Secondary | ICD-10-CM | POA: Diagnosis not present

## 2015-01-24 NOTE — Progress Notes (Signed)
   Subjective:    Patient ID: Billey ChangMary E Wurzel, female    DOB: 08/19/1922, 81 y.o.   MRN: [id number]  HPI  Patient Active Problem List   Diagnosis Date Noted  . At risk for falling---requires walker/ current Dan HumphreysWalker is severl years old and is beginning to bedysfunctional 09/01/2014  . Carotid bruit--- continues to be asymptomatic 01/14/2012  . Osteoporosis 01/14/2012  . HTN (hypertension)---[facility] 09/01/[facility]  . Scoliosis 09/01/2011  . Hyponatremia---time to recheck-- has no symptoms of confusion 09/01/2011  . Uterine prolapse 09/01/2011   Trying to move home derailed by needing care/monitoring Continues to write!! Active intellectually tho current living environment doesn't support this.  no change in daily activity   Review of Systems No new symptoms    No confusion  No shortness of breath or dyspnea on exertion  No palpitations  No sleep cycle abnormalities  Digestion good  She is aware of her balance issues and uses her walker faithfully Objective:   Physical Exam  BP   Pulse 80  Temp(Src) 98.4 F (36.9 C)  Resp 16  Ht 4\' 10"  (1.473 m)  Wt 73 lb (33.113 kg)  BMI 15.26 kg/m2  SpO2 98% Wt Readings from Last 3 Encounters:  01/24/15 73 lb (33.113 kg)  10/25/14 72 lb (32.659 kg)  08/10/13 75 lb 6.4 oz (34.201 kg)   she refuses to allow blood pressure checked today which is common in her visits PERRLA/EOMs conj Lungs Heart regular No edema Mood great//affect good Thought content normal     I examined her a walker which has a Omnicarewobbly Wheel and feels like it is less than sturdy when any weight is placed upon it Assessment & Plan:  Essential hypertension---stable  Hyponatremia---stable  Osteoporosis---degen disease/scoliosis with gait instability   plan - Continue same medicines - is not time for follow-up lab work - we will urge daughter and son-in-law to consider more stimulating living environment if home care is not possible - prescription for a  new walker

## 2015-01-26 DIAGNOSIS — N8111 Cystocele, midline: Secondary | ICD-10-CM | POA: Diagnosis not present

## 2015-02-12 DIAGNOSIS — B351 Tinea unguium: Secondary | ICD-10-CM | POA: Diagnosis not present

## 2015-02-12 DIAGNOSIS — L600 Ingrowing nail: Secondary | ICD-10-CM | POA: Diagnosis not present

## 2015-02-12 DIAGNOSIS — M79673 Pain in unspecified foot: Secondary | ICD-10-CM | POA: Diagnosis not present

## 2015-02-12 DIAGNOSIS — M2010 Hallux valgus (acquired), unspecified foot: Secondary | ICD-10-CM | POA: Diagnosis not present

## 2015-03-12 ENCOUNTER — Telehealth: Payer: Self-pay | Admitting: Internal Medicine

## 2015-03-12 NOTE — Telephone Encounter (Signed)
Vaishali's assistant Carmella request for Dr. Merla Riches to review and sign forms for [facility]. Please call Carmella when forms are ready. [phone] I will place the forms at 102 in the nurse's box. Thanks.

## 2015-03-13 NOTE — Telephone Encounter (Signed)
done

## 2015-03-13 NOTE — Telephone Encounter (Signed)
I will place in Dr. Netta Corrigan box.

## 2015-03-14 NOTE — Telephone Encounter (Signed)
Called Carmella and advised form ready to pick up.

## 2015-03-16 ENCOUNTER — Telehealth: Payer: Self-pay | Admitting: Internal Medicine

## 2015-03-16 NOTE — Telephone Encounter (Signed)
Patient states that all she needs is for Dr. Merla Riches to write an approval for her to be able to take her vitamins whenever she feels like it. She lives in a senior living facility and states that currently the staff are looking for approval from Dr. Merla Riches every single time she takes her vitamins. She just needs him to approve it one time so that they will leave her alone. Please advise.  [phone]

## 2015-03-17 ENCOUNTER — Telehealth: Payer: Self-pay | Admitting: Family Medicine

## 2015-03-17 NOTE — Telephone Encounter (Signed)
Called several times at facility to get in touch with patient they keep putting me on hold i actually called four times i also called camella he's out of office til July called the other numbers she had no good if she call back please let her know he appt with Merla Riches on 05/02/15 is cancelled to reschedule with him

## 2015-03-18 ENCOUNTER — Encounter: Payer: Self-pay | Admitting: Family Medicine

## 2015-03-27 ENCOUNTER — Telehealth: Payer: Self-pay

## 2015-03-27 NOTE — Telephone Encounter (Signed)
Pt is needing dr Merla Richesdoolittle to fax a new letter to where she lives that states she is able to take a mulit vitamin any time that she wants and that is can be self administered it has to say self administered  Fax number is [phone] Att:  Toma CopierShana Smith

## 2015-03-28 NOTE — Telephone Encounter (Signed)
Faxed

## 2015-03-28 NOTE — Telephone Encounter (Signed)
Done 6/26 per Revonda Standardallison so get them another copy if needed

## 2015-04-16 DIAGNOSIS — B351 Tinea unguium: Secondary | ICD-10-CM | POA: Diagnosis not present

## 2015-04-16 DIAGNOSIS — M79673 Pain in unspecified foot: Secondary | ICD-10-CM | POA: Diagnosis not present

## 2015-04-16 DIAGNOSIS — M2010 Hallux valgus (acquired), unspecified foot: Secondary | ICD-10-CM | POA: Diagnosis not present

## 2015-04-16 DIAGNOSIS — L600 Ingrowing nail: Secondary | ICD-10-CM | POA: Diagnosis not present

## 2015-05-02 ENCOUNTER — Ambulatory Visit: Payer: Medicare Other | Admitting: Internal Medicine

## 2015-05-14 DIAGNOSIS — N8111 Cystocele, midline: Secondary | ICD-10-CM | POA: Diagnosis not present

## 2015-05-23 ENCOUNTER — Ambulatory Visit (INDEPENDENT_AMBULATORY_CARE_PROVIDER_SITE_OTHER): Payer: Medicare Other | Admitting: Internal Medicine

## 2015-05-23 VITALS — HR 68 | Temp 98.1°F | Resp 18 | Ht <= 58 in | Wt 71.6 lb

## 2015-05-23 DIAGNOSIS — Z23 Encounter for immunization: Secondary | ICD-10-CM | POA: Diagnosis not present

## 2015-05-23 DIAGNOSIS — I10 Essential (primary) hypertension: Secondary | ICD-10-CM | POA: Diagnosis not present

## 2015-05-23 NOTE — Progress Notes (Signed)
F/u Patient Active Problem List   Diagnosis Date Noted  . At risk for falling 09/01/2014  . Carotid bruit 01/14/2012  . Osteoporosis 01/14/2012  . HTN (hypertension) 09/01/2011  . Scoliosis 09/01/2011  . Hyponatremia 09/01/2011  . Uterine prolapse 09/01/2011   She is most concerned that they might take away her ability to control her own vitamins. This is been an issue as you can see from the nursing home. She also has supplementary food above their diet plan that her daughter brings in for her that is more healthy.  She refuses to have her blood pressure taken today as usual. She remains asymptomatic  Exam Cognition intact, mood good affect appropriate. Heart regular without murmur Stable carotid bruit  Impression Problems as above The plan is to allow her activities that continue to establish her goal of a living situation where she has as many choices as possible. It is not likely that her family will let her come home. Being able to take her own choice of vitamins seems ok to me  No change in Norvasc She agrees to do home blood pressures Follow-up 2 months Flu shot

## 2015-05-23 NOTE — Patient Instructions (Signed)
An average person has a stride length of approximately 2.1 to 2.5 feet. That means that it takes over 2,000 steps to walk one mile; and 10,000 steps would be almost 5 miles. A sedentary person may only average 1,000 to 3,000 steps a day. For these people adding steps has many health benefits

## 2015-06-14 DIAGNOSIS — N8111 Cystocele, midline: Secondary | ICD-10-CM | POA: Diagnosis not present

## 2015-06-14 DIAGNOSIS — R238 Other skin changes: Secondary | ICD-10-CM | POA: Diagnosis not present

## 2015-06-18 DIAGNOSIS — L851 Acquired keratosis [keratoderma] palmaris et plantaris: Secondary | ICD-10-CM | POA: Diagnosis not present

## 2015-06-18 DIAGNOSIS — B351 Tinea unguium: Secondary | ICD-10-CM | POA: Diagnosis not present

## 2015-06-18 DIAGNOSIS — M79673 Pain in unspecified foot: Secondary | ICD-10-CM | POA: Diagnosis not present

## 2015-06-18 DIAGNOSIS — B070 Plantar wart: Secondary | ICD-10-CM | POA: Diagnosis not present

## 2015-06-27 DIAGNOSIS — N8111 Cystocele, midline: Secondary | ICD-10-CM | POA: Diagnosis not present

## 2015-07-24 DIAGNOSIS — N8111 Cystocele, midline: Secondary | ICD-10-CM | POA: Diagnosis not present

## 2015-07-25 ENCOUNTER — Ambulatory Visit (INDEPENDENT_AMBULATORY_CARE_PROVIDER_SITE_OTHER): Payer: Medicare Other | Admitting: Internal Medicine

## 2015-07-25 ENCOUNTER — Encounter: Payer: Self-pay | Admitting: Internal Medicine

## 2015-07-25 VITALS — HR 60 | Temp 98.3°F | Resp 18 | Ht <= 58 in | Wt 70.6 lb

## 2015-07-25 DIAGNOSIS — I10 Essential (primary) hypertension: Secondary | ICD-10-CM | POA: Diagnosis not present

## 2015-07-25 DIAGNOSIS — Z9181 History of falling: Secondary | ICD-10-CM

## 2015-07-26 NOTE — Progress Notes (Signed)
This office visit is at her request in order to continue this struggle to allow her to administer her own vitamins. The living center is asking for me to set doses for each of the vitamins for her She otherwise feels good Patient Active Problem List   Diagnosis Date Noted  . At risk for falling 09/01/2014  . Carotid bruit 01/14/2012  . Osteoporosis 01/14/2012  . HTN (hypertension) 09/01/2011  . Scoliosis 09/01/2011  . Hyponatremia 09/01/2011  . Uterine prolapse---Dr Ambrose MantleHenley would like to take this out and they are meeting again in one month to discuss this  09/01/2011   Outpatient Encounter Prescriptions as of 07/25/2015  Medication Sig  . amLODipine (NORVASC) 5 MG tablet Take 1 tablet (5 mg total) by mouth daily.  Marland Kitchen. dextran 70-hypromellose (TEARS RENEWED) ophthalmic solution Place 1 drop into both eyes 4 (four) times daily as needed.   No facility-administered encounter medications on file as of 07/25/2015.   Exam Once again she refuses to have her blood pressure taken which is common  Impression 1-hypertension  2-amazingly resilient at her age--her father lived well into his 3290s [place] and she has an aunt who lived to 5120   Plan -Continue Norvasc -She should be allowed to take her own daily vitamins at the following maximum limits: A=33000mcg/d B6=100mg /d Folate=103400mcg/dB12 107700mcg/d C=2000mg /d D=600mg /d E=1000mg /d lutein 10mg  Zinc 80mg  Copper 2mg   Follow-up one month check blood pressure and consider blood work I would favor hysterectomy at this point because of her true for problems or pessary refitting frequently

## 2015-08-02 ENCOUNTER — Emergency Department (HOSPITAL_COMMUNITY)
Admission: EM | Admit: 2015-08-02 | Discharge: 2015-08-02 | Disposition: A | Discharge status: Home / Self Care | Payer: Medicare Other | Attending: Emergency Medicine | Admitting: Emergency Medicine

## 2015-08-02 ENCOUNTER — Encounter (HOSPITAL_COMMUNITY): Payer: Self-pay | Admitting: Family Medicine

## 2015-08-02 ENCOUNTER — Emergency Department (HOSPITAL_COMMUNITY): Payer: Medicare Other

## 2015-08-02 DIAGNOSIS — Y998 Other external cause status: Secondary | ICD-10-CM | POA: Diagnosis not present

## 2015-08-02 DIAGNOSIS — S0181XA Laceration without foreign body of other part of head, initial encounter: Secondary | ICD-10-CM

## 2015-08-02 DIAGNOSIS — Y92128 Other place in nursing home as the place of occurrence of the external cause: Secondary | ICD-10-CM | POA: Insufficient documentation

## 2015-08-02 DIAGNOSIS — S299XXA Unspecified injury of thorax, initial encounter: Secondary | ICD-10-CM | POA: Diagnosis not present

## 2015-08-02 DIAGNOSIS — W01198A Fall on same level from slipping, tripping and stumbling with subsequent striking against other object, initial encounter: Secondary | ICD-10-CM | POA: Insufficient documentation

## 2015-08-02 DIAGNOSIS — S199XXA Unspecified injury of neck, initial encounter: Secondary | ICD-10-CM | POA: Diagnosis not present

## 2015-08-02 DIAGNOSIS — Z23 Encounter for immunization: Secondary | ICD-10-CM | POA: Insufficient documentation

## 2015-08-02 DIAGNOSIS — Z87891 Personal history of nicotine dependence: Secondary | ICD-10-CM | POA: Diagnosis not present

## 2015-08-02 DIAGNOSIS — I959 Hypotension, unspecified: Secondary | ICD-10-CM | POA: Insufficient documentation

## 2015-08-02 DIAGNOSIS — S0990XA Unspecified injury of head, initial encounter: Secondary | ICD-10-CM

## 2015-08-02 DIAGNOSIS — Z79899 Other long term (current) drug therapy: Secondary | ICD-10-CM | POA: Diagnosis not present

## 2015-08-02 DIAGNOSIS — Y9389 Activity, other specified: Secondary | ICD-10-CM | POA: Insufficient documentation

## 2015-08-02 DIAGNOSIS — E871 Hypo-osmolality and hyponatremia: Secondary | ICD-10-CM | POA: Insufficient documentation

## 2015-08-02 DIAGNOSIS — R0781 Pleurodynia: Secondary | ICD-10-CM | POA: Diagnosis not present

## 2015-08-02 DIAGNOSIS — S0180XA Unspecified open wound of other part of head, initial encounter: Secondary | ICD-10-CM | POA: Diagnosis not present

## 2015-08-02 DIAGNOSIS — I10 Essential (primary) hypertension: Secondary | ICD-10-CM | POA: Insufficient documentation

## 2015-08-02 HISTORY — DX: Hypo-osmolality and hyponatremia: E87.1

## 2015-08-02 HISTORY — DX: Essential (primary) hypertension: I10

## 2015-08-02 LAB — URINALYSIS, ROUTINE W REFLEX MICROSCOPIC
Bilirubin Urine: NEGATIVE
GLUCOSE, UA: NEGATIVE mg/dL
Ketones, ur: NEGATIVE mg/dL
Leukocytes, UA: NEGATIVE
Nitrite: NEGATIVE
PH: 7.5 (ref 5.0–8.0)
Protein, ur: NEGATIVE mg/dL
SPECIFIC GRAVITY, URINE: 1.015 (ref 1.005–1.030)
Urobilinogen, UA: 0.2 mg/dL (ref 0.0–1.0)

## 2015-08-02 LAB — CBC WITH DIFFERENTIAL/PLATELET
BASOS PCT: 1 %
Basophils Absolute: 0 10*3/uL (ref 0.0–0.1)
Eosinophils Absolute: 0.1 10*3/uL (ref 0.0–0.7)
Eosinophils Relative: 1 %
HEMATOCRIT: 36.2 % (ref 36.0–46.0)
HEMOGLOBIN: 12.2 g/dL (ref 12.0–15.0)
LYMPHS ABS: 1.3 10*3/uL (ref 0.7–4.0)
Lymphocytes Relative: 19 %
MCH: 31.4 pg (ref 26.0–34.0)
MCHC: 33.7 g/dL (ref 30.0–36.0)
MCV: 93.3 fL (ref 78.0–100.0)
MONOS PCT: 8 %
Monocytes Absolute: 0.6 10*3/uL (ref 0.1–1.0)
NEUTROS ABS: 5.1 10*3/uL (ref 1.7–7.7)
NEUTROS PCT: 71 %
Platelets: 382 10*3/uL (ref 150–400)
RBC: 3.88 MIL/uL (ref 3.87–5.11)
RDW: 12.8 % (ref 11.5–15.5)
WBC: 7 10*3/uL (ref 4.0–10.5)

## 2015-08-02 LAB — COMPREHENSIVE METABOLIC PANEL
ALBUMIN: 3.4 g/dL — AB (ref 3.5–5.0)
ALK PHOS: 69 U/L (ref 38–126)
ALT: 17 U/L (ref 14–54)
ANION GAP: 8 (ref 5–15)
AST: 21 U/L (ref 15–41)
BILIRUBIN TOTAL: 0.5 mg/dL (ref 0.3–1.2)
BUN: 15 mg/dL (ref 6–20)
CALCIUM: 9.2 mg/dL (ref 8.9–10.3)
CO2: 29 mmol/L (ref 22–32)
CREATININE: 0.53 mg/dL (ref 0.44–1.00)
Chloride: 91 mmol/L — ABNORMAL LOW (ref 101–111)
GFR calc Af Amer: >60 mL/min (ref >60)
GFR calc non Af Amer: >60 mL/min (ref >60)
GLUCOSE: 119 mg/dL — AB (ref 65–99)
Potassium: 4.4 mmol/L (ref 3.5–5.1)
Sodium: 128 mmol/L — ABNORMAL LOW (ref 135–145)
TOTAL PROTEIN: 5.6 g/dL — AB (ref 6.5–8.1)

## 2015-08-02 LAB — I-STAT CHEM 8, ED
BUN: 16 mg/dL (ref 6–20)
CREATININE: 0.6 mg/dL (ref 0.44–1.00)
Calcium, Ion: 1.2 mmol/L (ref 1.13–1.30)
Chloride: 91 mmol/L — ABNORMAL LOW (ref 101–111)
GLUCOSE: 116 mg/dL — AB (ref 65–99)
HCT: 38 % (ref 36.0–46.0)
HEMOGLOBIN: 12.9 g/dL (ref 12.0–15.0)
POTASSIUM: 4.4 mmol/L (ref 3.5–5.1)
Sodium: 128 mmol/L — ABNORMAL LOW (ref 135–145)
TCO2: 29 mmol/L (ref 0–100)

## 2015-08-02 LAB — URINE MICROSCOPIC-ADD ON

## 2015-08-02 LAB — CBG MONITORING, ED: Glucose-Capillary: 129 mg/dL — ABNORMAL HIGH (ref 65–99)

## 2015-08-02 LAB — I-STAT TROPONIN, ED: Troponin i, poc: 0.01 ng/mL (ref 0.00–0.08)

## 2015-08-02 LAB — I-STAT CG4 LACTIC ACID, ED: Lactic Acid, Venous: 0.88 mmol/L (ref 0.5–2.0)

## 2015-08-02 MED ORDER — SODIUM CHLORIDE 0.9 % IV BOLUS (SEPSIS)
1000.0000 mL | Freq: Once | INTRAVENOUS | Status: AC
  Administered 2015-08-02: 1000 mL via INTRAVENOUS

## 2015-08-02 MED ORDER — LIDOCAINE-EPINEPHRINE 2 %-1:100000 IJ SOLN
20.0000 mL | Freq: Once | INTRAMUSCULAR | Status: AC
  Administered 2015-08-02: 20 mL via INTRADERMAL
  Filled 2015-08-02: qty 1

## 2015-08-02 MED ORDER — LIDOCAINE-EPINEPHRINE (PF) 2 %-1:200000 IJ SOLN: 10.0000 mL | Freq: Once | INTRAMUSCULAR | Status: DC

## 2015-08-02 MED ORDER — SODIUM CHLORIDE 0.9 % IV BOLUS (SEPSIS): 1000.0000 mL | Freq: Once | INTRAVENOUS | Status: DC

## 2015-08-02 MED ORDER — TETANUS-DIPHTH-ACELL PERTUSSIS 5-2.5-18.5 LF-MCG/0.5 IM SUSP
0.5000 mL | Freq: Once | INTRAMUSCULAR | Status: AC
  Administered 2015-08-02: 0.5 mL via INTRAMUSCULAR
  Filled 2015-08-02: qty 0.5

## 2015-08-02 MED ORDER — AMLODIPINE BESYLATE 5 MG PO TABS
5.0000 mg | ORAL_TABLET | Freq: Once | ORAL | Status: AC
  Administered 2015-08-02: 5 mg via ORAL
  Filled 2015-08-02: qty 1

## 2015-08-02 NOTE — ED Notes (Signed)
Ben, PA at bedside. 

## 2015-08-02 NOTE — ED Notes (Signed)
MD at bedside. 

## 2015-08-02 NOTE — ED Notes (Signed)
Pt urinated, but had a BM in sample.

## 2015-08-02 NOTE — ED Notes (Signed)
Bed: WA09 Expected date:  Expected time:  Means of arrival:  Comments: EMS, elderly fall

## 2015-08-02 NOTE — Discharge Instructions (Signed)
Suture removal in a week.   Stay hydrated.   Recheck sodium level in a week.   See your doctor.   Return to ER if you have weakness, fall, headaches, vomiting, fevers.    Facial Laceration  A facial laceration is a cut on the face. These injuries can be painful and cause bleeding. Lacerations usually heal quickly, but they need special care to reduce scarring. DIAGNOSIS  Your health care provider will take a medical history, ask for details about how the injury occurred, and examine the wound to determine how deep the cut is. TREATMENT  Some facial lacerations may not require closure. Others may not be able to be closed because of an increased risk of infection. The risk of infection and the chance for successful closure will depend on various factors, including the amount of time since the injury occurred. The wound may be cleaned to help prevent infection. If closure is appropriate, pain medicines may be given if needed. Your health care provider will use stitches (sutures), wound glue (adhesive), or skin adhesive strips to repair the laceration. These tools bring the skin edges together to allow for faster healing and a better cosmetic outcome. If needed, you may also be given a tetanus shot. HOME CARE INSTRUCTIONS  Only take over-the-counter or prescription medicines as directed by your health care provider.  Follow your health care provider's instructions for wound care. These instructions will vary depending on the technique used for closing the wound. For Sutures:  Keep the wound clean and dry.   If you were given a bandage (dressing), you should change it at least once a day. Also change the dressing if it becomes wet or dirty, or as directed by your health care provider.   Wash the wound with soap and water 2 times a day. Rinse the wound off with water to remove all soap. Pat the wound dry with a clean towel.   After cleaning, apply a thin layer of the antibiotic ointment  recommended by your health care provider. This will help prevent infection and keep the dressing from sticking.   You may shower as usual after the first 24 hours. Do not soak the wound in water until the sutures are removed.   Get your sutures removed as directed by your health care provider. With facial lacerations, sutures should usually be taken out after 4-5 days to avoid stitch marks.   Wait a few days after your sutures are removed before applying any makeup. For Skin Adhesive Strips:  Keep the wound clean and dry.   Do not get the skin adhesive strips wet. You may bathe carefully, using caution to keep the wound dry.   If the wound gets wet, pat it dry with a clean towel.   Skin adhesive strips will fall off on their own. You may trim the strips as the wound heals. Do not remove skin adhesive strips that are still stuck to the wound. They will fall off in time.  For Wound Adhesive:  You may briefly wet your wound in the shower or bath. Do not soak or scrub the wound. Do not swim. Avoid periods of heavy sweating until the skin adhesive has fallen off on its own. After showering or bathing, gently pat the wound dry with a clean towel.   Do not apply liquid medicine, cream medicine, ointment medicine, or makeup to your wound while the skin adhesive is in place. This may loosen the film before your wound is healed.  If a dressing is placed over the wound, be careful not to apply tape directly over the skin adhesive. This may cause the adhesive to be pulled off before the wound is healed.   Avoid prolonged exposure to sunlight or tanning lamps while the skin adhesive is in place.  The skin adhesive will usually remain in place for 5-10 days, then naturally fall off the skin. Do not pick at the adhesive film.  After Healing: Once the wound has healed, cover the wound with sunscreen during the day for 1 full year. This can help minimize scarring. Exposure to ultraviolet light  in the first year will darken the scar. It can take 1-2 years for the scar to lose its redness and to heal completely.  SEEK MEDICAL CARE IF:  You have a fever. SEEK IMMEDIATE MEDICAL CARE IF:  You have redness, pain, or swelling around the wound.   You see ayellowish-white fluid (pus) coming from the wound.    This information is not intended to replace advice given to you by your health care provider. Make sure you discuss any questions you have with your health care provider.   Document Released: 10/16/2004 Document Revised: 09/29/2014 Document Reviewed: 04/21/2013 Elsevier Interactive Patient Education 2016 [employer]  Head Injury, Adult You have a head injury. Headaches and throwing up (vomiting) are common after a head injury. It should be easy to wake up from sleeping. Sometimes you must stay in the hospital. Most problems happen within the first 24 hours. Side effects may occur up to 7-10 days after the injury.  WHAT ARE THE TYPES OF HEAD INJURIES? Head injuries can be as minor as a bump. Some head injuries can be more severe. More severe head injuries include:  A jarring injury to the brain (concussion).  A bruise of the brain (contusion). This mean there is bleeding in the brain that can cause swelling.  A cracked skull (skull fracture).  Bleeding in the brain that collects, clots, and forms a bump (hematoma). WHEN SHOULD I GET HELP RIGHT AWAY?   You are confused or sleepy.  You cannot be woken up.  You feel sick to your stomach (nauseous) or keep throwing up (vomiting).  Your dizziness or unsteadiness is getting worse.  You have very bad, lasting headaches that are not helped by medicine. Take medicines only as told by your doctor.  You cannot use your arms or legs like normal.  You cannot walk.  You notice changes in the black spots in the center of the colored part of your eye (pupil).  You have clear or bloody fluid coming from your nose or  ears.  You have trouble seeing. During the next 24 hours after the injury, you must stay with someone who can watch you. This person should get help right away (call 911 in the [place] if you start to shake and are not able to control it (have seizures), you pass out, or you are unable to wake up. HOW CAN I PREVENT A HEAD INJURY IN THE FUTURE?  Wear seat belts.  Wear a helmet while bike riding and playing sports like football.  Stay away from dangerous activities around the house. WHEN CAN I RETURN TO NORMAL ACTIVITIES AND ATHLETICS? See your doctor before doing these activities. You should not do normal activities or play contact sports until 1 week after the following symptoms have stopped:  Headache that does not go away.  Dizziness.  Poor attention.  Confusion.  Memory problems.  Sickness to your stomach or throwing up.  Tiredness.  Fussiness.  Bothered by bright lights or loud noises.  Anxiousness or depression.  Restless sleep. MAKE SURE YOU:   Understand these instructions.  Will watch your condition.  Will get help right away if you are not doing well or get worse.   This information is not intended to replace advice given to you by your health care provider. Make sure you discuss any questions you have with your health care provider.   Document Released: 08/21/2008 Document Revised: 09/29/2014 Document Reviewed: 05/16/2013 Elsevier Interactive Patient Education 2016 [employer].  Hypertension Hypertension, commonly called high blood pressure, is when the force of blood pumping through your arteries is too strong. Your arteries are the blood vessels that carry blood from your heart throughout your body. A blood pressure reading consists of a higher number over a lower number, such as 110/72. The higher number (systolic) is the pressure inside your arteries when your heart pumps. The lower number (diastolic) is the pressure inside your arteries when your heart  relaxes. Ideally you want your blood pressure below 120/80. Hypertension forces your heart to work harder to pump blood. Your arteries may become narrow or stiff. Having untreated or uncontrolled hypertension can cause heart attack, stroke, kidney disease, and other problems. RISK FACTORS Some risk factors for high blood pressure are controllable. Others are not.  Risk factors you cannot control include:   Race. You may be at higher risk if you are African American.  Age. Risk increases with age.  Gender. Men are at higher risk than women before age 18 years. After age 2, women are at higher risk than men. Risk factors you can control include:  Not getting enough exercise or physical activity.  Being overweight.  Getting too much fat, sugar, calories, or salt in your diet.  Drinking too much alcohol. SIGNS AND SYMPTOMS Hypertension does not usually cause signs or symptoms. Extremely high blood pressure (hypertensive crisis) may cause headache, anxiety, shortness of breath, and nosebleed. DIAGNOSIS To check if you have hypertension, your health care provider will measure your blood pressure while you are seated, with your arm held at the level of your heart. It should be measured at least twice using the same arm. Certain conditions can cause a difference in blood pressure between your right and left arms. A blood pressure reading that is higher than normal on one occasion does not mean that you need treatment. If it is not clear whether you have high blood pressure, you may be asked to return on a different day to have your blood pressure checked again. Or, you may be asked to monitor your blood pressure at home for 1 or more weeks. TREATMENT Treating high blood pressure includes making lifestyle changes and possibly taking medicine. Living a healthy lifestyle can help lower high blood pressure. You may need to change some of your habits. Lifestyle changes may include:  Following the  DASH diet. This diet is high in fruits, vegetables, and whole grains. It is low in salt, red meat, and added sugars.  Keep your sodium intake below 2,300 mg per day.  Getting at least 30-45 minutes of aerobic exercise at least 4 times per week.  Losing weight if necessary.  Not smoking.  Limiting alcoholic beverages.  Learning ways to reduce stress. Your health care provider may prescribe medicine if lifestyle changes are not enough to get your blood pressure under control, and if one of the following is  true:  You are 37-25 years of age and your systolic blood pressure is above 140.  You are 54 years of age or older, and your systolic blood pressure is above 150.  Your diastolic blood pressure is above 90.  You have diabetes, and your systolic blood pressure is over 140 or your diastolic blood pressure is over 90.  You have kidney disease and your blood pressure is above 140/90.  You have heart disease and your blood pressure is above 140/90. Your personal target blood pressure may vary depending on your medical conditions, your age, and other factors. HOME CARE INSTRUCTIONS  Have your blood pressure rechecked as directed by your health care provider.   Take medicines only as directed by your health care provider. Follow the directions carefully. Blood pressure medicines must be taken as prescribed. The medicine does not work as well when you skip doses. Skipping doses also puts you at risk for problems.  Do not smoke.   Monitor your blood pressure at home as directed by your health care provider. SEEK MEDICAL CARE IF:   You think you are having a reaction to medicines taken.  You have recurrent headaches or feel dizzy.  You have swelling in your ankles.  You have trouble with your vision. SEEK IMMEDIATE MEDICAL CARE IF:  You develop a severe headache or confusion.  You have unusual weakness, numbness, or feel faint.  You have severe chest or abdominal  pain.  You vomit repeatedly.  You have trouble breathing. MAKE SURE YOU:   Understand these instructions.  Will watch your condition.  Will get help right away if you are not doing well or get worse.   This information is not intended to replace advice given to you by your health care provider. Make sure you discuss any questions you have with your health care provider.   Document Released: 09/08/2005 Document Revised: 01/23/2015 Document Reviewed: 07/01/2013 Elsevier Interactive Patient Education [employer]

## 2015-08-02 NOTE — ED Notes (Signed)
Patient is a resident [facility] and brought in due to a fall. Pt was leaning over her radiator in her room and fell on top of it. No loss of conscious. Pt suffered a laceration to her right forehead. EMS reports is about 1.5 inches long and bleeding is controlled with bandage.

## 2015-08-02 NOTE — ED Provider Notes (Signed)
LACERATION REPAIR Performed by: Sharlene Mottsartner, Josselin Gaulin W Authorized by: Sharlene Mottsartner, Aaralyn Kil W Consent: Verbal consent obtained. Risks and benefits: risks, benefits and alternatives were discussed Consent given by: patient Patient identity confirmed: provided demographic data Prepped and Draped in normal sterile fashion Wound explored  Laceration Location: Right forehead  Laceration Length: 3 cm  No Foreign Bodies seen or palpated  Anesthesia: local infiltration  Local anesthetic: lidocaine 2 with %  epinephrine  Anesthetic total: 3 ml  Irrigation method: syringe Amount of cleaning: standard  Skin closure: 5-0 Prolene   Number of sutures: 5   Technique: Simple interrupted   Patient tolerance: Patient tolerated the procedure well with no immediate complications.   Gina PeekBenjamin Raushanah Osmundson, PA-C 08/02/15 [phone]

## 2015-08-02 NOTE — ED Provider Notes (Signed)
TIME SEEN: 5:55 AM  CHIEF COMPLAINT: Head injury, laceration  HPI: Pt is a 81 y.o. female with history of hypertension who presents emergency department with a mechanical fall. Patient is a resident of [facility] and states that she was leaning over her radiator in her room when she fell striking her head. Denies loss of consciousness. Denies numbness, tingling or focal weakness. No vomiting. Has a laceration to the right forehead. She is unsure when her last tetanus vaccination was. No other injury.  ROS: See HPI Constitutional: no fever  Eyes: no drainage or vision changes ENT: no runny nose   Cardiovascular:  no chest pain  Resp: no SOB  GI: no vomiting GU: no dysuria Integumentary: no rash  Allergy: no hives  Musculoskeletal: no leg swelling  Neurological: no slurred speech, numbness, focal weakness ROS otherwise negative  PAST MEDICAL HISTORY/PAST SURGICAL HISTORY:  Past Medical History  Diagnosis Date  . Hypertension   . Hypo-osmolality and hyponatremia     MEDICATIONS:  Prior to Admission medications   Medication Sig Start Date End Date Taking? Authorizing Provider  amLODipine (NORVASC) 5 MG tablet Take 1 tablet (5 mg total) by mouth daily. 09/20/14   Tonye Pearson, MD  dextran 70-hypromellose (TEARS RENEWED) ophthalmic solution Place 1 drop into both eyes 4 (four) times daily as needed. 04/18/13   Tonye Pearson, MD    ALLERGIES:  No Known Allergies  SOCIAL HISTORY:  Social History  Substance Use Topics  . Smoking status: Former Smoker -- 0.30 packs/day for 1 years    Types: Cigarettes    Quit date: 01/13/1949  . Smokeless tobacco: Not on file  . Alcohol Use: No    FAMILY HISTORY: History reviewed. No pertinent family history.  EXAM: BP 205/82 mmHg  Pulse 69  Temp(Src) 97.7 F (36.5 C) (Oral)  Resp 16  SpO2 99% CONSTITUTIONAL: Alert and oriented and responds appropriately to questions. Well-appearing; well-nourished; GCS 15, elderly, pleasant,  in no distress HEAD: Normocephalic; 6 cm superficial laceration to the right forehead EYES: Conjunctivae clear, PERRL, EOMI, reports normal vision ENT: normal nose; no rhinorrhea; moist mucous membranes; pharynx without lesions noted; no dental injury; no septal hematoma NECK: Supple, no meningismus, no LAD; no midline spinal tenderness, step-off or deformity CARD: RRR; S1 and S2 appreciated; no murmurs, no clicks, no rubs, no gallops RESP: Normal chest excursion without splinting or tachypnea; breath sounds clear and equal bilaterally; no wheezes, no rhonchi, no rales; no hypoxia or respiratory distress CHEST:  chest wall stable, no crepitus or ecchymosis or deformity, nontender to palpation ABD/GI: Normal bowel sounds; non-distended; soft, non-tender, no rebound, no guarding PELVIS:  stable, nontender to palpation BACK:  The back appears normal and is non-tender to palpation, there is no CVA tenderness; no midline spinal tenderness, step-off or deformity, patient has thoracic kyphosis EXT: Normal ROM in all joints; non-tender to palpation; no edema; normal capillary refill; no cyanosis, no bony tenderness or bony deformity of patient's extremities, no joint effusion, no ecchymosis or lacerations    SKIN: Normal color for age and race; warm NEURO: Moves all extremities equally, sensation to light touch intact diffusely, cranial nerves II through XII intact PSYCH: The patient's mood and manner are appropriate. Grooming and personal hygiene are appropriate.  MEDICAL DECISION MAKING: Patient here with mechanical fall. She has a laceration to her forehead that we will repair. We'll update her tetanus vaccination. We'll obtain CT of her head and cervical spine. She is hypertensive but is asymptomatic. States  she is on Norvasc. Will give dose of her Norvasc in the ED.  ED PROGRESS: CT scan show no acute injury other than supraorbital laceration. Will repair her laceration and recheck blood pressure.  Given her morning dose of Norvasc 5mg  this morning.  7:30 AM  Laceration repaired by Mayme GentaBen Cartner, PA. On reevaluation patient is now found to be hypotensive.  Rechecked on both arms with small appropriate sized blood pressure cuff. When she is sitting upright initially she was talking, interactive, pleasant, alert. Now she is drowsy. CBG was 129. When lying flat she becomes responsive again. Heart rate in the 50s. Will obtain labs, cultures, urine, chest x-ray, EKG. Differential diagnosis is large. She denies any complaints of pain currently. We'll give IV fluid boluses. Signed out to Dr. Silverio LayYao to follow up.     Layla MawKristen N Danene Montijo, DO 08/02/15 [phone]

## 2015-08-02 NOTE — ED Provider Notes (Signed)
Physical Exam  BP 140/61 mmHg  Pulse 65  Temp(Src) 98.9 F (37.2 C) (Oral)  Resp 22  SpO2 97%  Physical Exam  ED Course  Procedures  MDM Care assumed at sign out at 7 am from Dr. Elesa Massed. Patient came in this morning for mechanical fall. CT head/neck unremarkable. Hypertensive to 220 on arrival and given daily dose of norvasc but dropped BP to 70s bilaterally. States that she felt well. Sign out pending workup for sepsis workup for hypotension and reassess BP after IVF. Labs showed Na 128, baseline for her. Ate and drank in the ED, felt fine. Laceration sutured by the PA. UA nl, lactate nl, cultures sent. CXR clear. Has bruising R chest with no fractures. Lungs clear. BP 140s on discharge. Vitals otherwise stable. Will dc back to nursing home.   Results for orders placed or performed during the hospital encounter of 08/02/15  CBC with Differential  Result Value Ref Range   WBC 7.0 4.0 - 10.5 K/uL   RBC 3.88 3.87 - 5.11 MIL/uL   Hemoglobin 12.2 12.0 - 15.0 g/dL   HCT 47.8 29.5 - 62.1 %   MCV 93.3 78.0 - 100.0 fL   MCH 31.4 26.0 - 34.0 pg   MCHC 33.7 30.0 - 36.0 g/dL   RDW 30.8 65.7 - 84.6 %   Platelets 382 150 - 400 K/uL   Neutrophils Relative % 71 %   Neutro Abs 5.1 1.7 - 7.7 K/uL   Lymphocytes Relative 19 %   Lymphs Abs 1.3 0.7 - 4.0 K/uL   Monocytes Relative 8 %   Monocytes Absolute 0.6 0.1 - 1.0 K/uL   Eosinophils Relative 1 %   Eosinophils Absolute 0.1 0.0 - 0.7 K/uL   Basophils Relative 1 %   Basophils Absolute 0.0 0.0 - 0.1 K/uL  Comprehensive metabolic panel  Result Value Ref Range   Sodium 128 (L) 135 - 145 mmol/L   Potassium 4.4 3.5 - 5.1 mmol/L   Chloride 91 (L) 101 - 111 mmol/L   CO2 29 22 - 32 mmol/L   Glucose, Bld 119 (H) 65 - 99 mg/dL   BUN 15 6 - 20 mg/dL   Creatinine, Ser 9.62 0.44 - 1.00 mg/dL   Calcium 9.2 8.9 - 95.2 mg/dL   Total Protein 5.6 (L) 6.5 - 8.1 g/dL   Albumin 3.4 (L) 3.5 - 5.0 g/dL   AST 21 15 - 41 U/L   ALT 17 14 - 54 U/L   Alkaline  Phosphatase 69 38 - 126 U/L   Total Bilirubin 0.5 0.3 - 1.2 mg/dL   GFR calc non Af Amer >60 >60 mL/min   GFR calc Af Amer >60 >60 mL/min   Anion gap 8 5 - 15  Urinalysis, Routine w reflex microscopic (not at [facility])  Result Value Ref Range   Color, Urine YELLOW YELLOW   APPearance CLOUDY (A) CLEAR   Specific Gravity, Urine 1.015 1.005 - 1.030   pH 7.5 5.0 - 8.0   Glucose, UA NEGATIVE NEGATIVE mg/dL   Hgb urine dipstick SMALL (A) NEGATIVE   Bilirubin Urine NEGATIVE NEGATIVE   Ketones, ur NEGATIVE NEGATIVE mg/dL   Protein, ur NEGATIVE NEGATIVE mg/dL   Urobilinogen, UA 0.2 0.0 - 1.0 mg/dL   Nitrite NEGATIVE NEGATIVE   Leukocytes, UA NEGATIVE NEGATIVE  Urine microscopic-add on  Result Value Ref Range   Squamous Epithelial / LPF RARE RARE   RBC / HPF 3-6 <3 RBC/hpf   Bacteria, UA MANY (A) RARE  I-stat troponin, ED  Result Value Ref Range   Troponin i, poc 0.01 0.00 - 0.08 ng/mL   Comment 3          CBG monitoring, ED  Result Value Ref Range   Glucose-Capillary 129 (H) 65 - 99 mg/dL   Comment 1 Notify RN    Comment 2 Document in Chart   I-Stat CG4 Lactic Acid, ED  Result Value Ref Range   Lactic Acid, Venous 0.88 0.5 - 2.0 mmol/L  I-stat chem 8, ed  Result Value Ref Range   Sodium 128 (L) 135 - 145 mmol/L   Potassium 4.4 3.5 - 5.1 mmol/L   Chloride 91 (L) 101 - 111 mmol/L   BUN 16 6 - 20 mg/dL   Creatinine, Ser 4.78 0.44 - 1.00 mg/dL   Glucose, Bld 295 (H) 65 - 99 mg/dL   Calcium, Ion 6.21 3.08 - 1.30 mmol/L   TCO2 29 0 - 100 mmol/L   Hemoglobin 12.9 12.0 - 15.0 g/dL   HCT 65.7 84.6 - 96.2 %   Dg Ribs Unilateral W/chest Right  08/02/2015  CLINICAL DATA:  Fall today.  Right-sided rib pain. EXAM: RIGHT RIBS AND CHEST - 3+ VIEW COMPARISON:  Two-view chest x-ray 12/01/2012. FINDINGS: Heart size is normal. Atherosclerotic calcifications are present in the aorta. Scoliosis is present. Emphysema is again noted. Dedicated imaging of the ribs demonstrate no acute or healing  fractures. IMPRESSION: 1. No acute abnormality or significant interval change. 2. No evidence for acute or healing fractures. 3. Stable emphysema and scoliosis. Electronically Signed   By: Marin Roberts M.D.   On: 08/02/2015 10:38   Ct Head Wo Contrast  08/02/2015  CLINICAL DATA:  Fall at nursing home with forehead laceration on the right. Initial encounter. EXAM: CT HEAD WITHOUT CONTRAST CT CERVICAL SPINE WITHOUT CONTRAST TECHNIQUE: Multidetector CT imaging of the head and cervical spine was performed following the standard protocol without intravenous contrast. Multiplanar CT image reconstructions of the cervical spine were also generated. COMPARISON:  None. FINDINGS: CT HEAD FINDINGS Skull and Sinuses:Deep laceration to the right forehead without opaque foreign body or underlying fracture. There is extensive opacification of bilateral ethmoid and frontal sinuses with fluid levels within the frontal sinuses. No sinus expansion or bony destruction. Orbits: Bilateral cataract resection.  No traumatic finding. Brain: No evidence of acute infarction, hemorrhage, hydrocephalus, or mass lesion/mass effect. Chronic small vessel ischemic change with mild gliosis around the lateral ventricles. Remote lacunar infarct in the upper right cerebellum. Cerebral volume within normal limits for age. CT CERVICAL SPINE FINDINGS No evidence of acute fracture. No gross cervical canal hematoma or prevertebral edema. There is 3 mm of C4-5 anterolisthesis associated with advanced facet arthropathy, the most likely cause. C3-4 anterolisthesis is milder but also appears facet mediated. Degenerative disc disease with narrowing greatest at C5-6 and C6-7. IMPRESSION: 1. Negative for acute intracranial finding or cervical spine fracture. 2. Right supraorbital laceration without fracture or foreign body. 3. C3-4 and C4-5 anterolisthesis which appears degenerative. 4. Advanced ethmoid and frontal sinusitis with fluid levels.  Electronically Signed   By: Marnee Spring M.D.   On: 08/02/2015 06:45   Ct Cervical Spine Wo Contrast  08/02/2015  CLINICAL DATA:  Fall at nursing home with forehead laceration on the right. Initial encounter. EXAM: CT HEAD WITHOUT CONTRAST CT CERVICAL SPINE WITHOUT CONTRAST TECHNIQUE: Multidetector CT imaging of the head and cervical spine was performed following the standard protocol without intravenous contrast. Multiplanar CT image reconstructions of the  cervical spine were also generated. COMPARISON:  None. FINDINGS: CT HEAD FINDINGS Skull and Sinuses:Deep laceration to the right forehead without opaque foreign body or underlying fracture. There is extensive opacification of bilateral ethmoid and frontal sinuses with fluid levels within the frontal sinuses. No sinus expansion or bony destruction. Orbits: Bilateral cataract resection.  No traumatic finding. Brain: No evidence of acute infarction, hemorrhage, hydrocephalus, or mass lesion/mass effect. Chronic small vessel ischemic change with mild gliosis around the lateral ventricles. Remote lacunar infarct in the upper right cerebellum. Cerebral volume within normal limits for age. CT CERVICAL SPINE FINDINGS No evidence of acute fracture. No gross cervical canal hematoma or prevertebral edema. There is 3 mm of C4-5 anterolisthesis associated with advanced facet arthropathy, the most likely cause. C3-4 anterolisthesis is milder but also appears facet mediated. Degenerative disc disease with narrowing greatest at C5-6 and C6-7. IMPRESSION: 1. Negative for acute intracranial finding or cervical spine fracture. 2. Right supraorbital laceration without fracture or foreign body. 3. C3-4 and C4-5 anterolisthesis which appears degenerative. 4. Advanced ethmoid and frontal sinusitis with fluid levels. Electronically Signed   By: Marnee SpringJonathon  Watts M.D.   On: 08/02/2015 06:45      Richardean Canalavid H Yao, MD 08/02/15 1359

## 2015-08-03 LAB — URINE CULTURE: Culture: 9000

## 2015-08-07 LAB — CULTURE, BLOOD (ROUTINE X 2)
CULTURE: NO GROWTH
Culture: NO GROWTH

## 2015-08-09 ENCOUNTER — Ambulatory Visit (INDEPENDENT_AMBULATORY_CARE_PROVIDER_SITE_OTHER): Payer: Medicare Other | Admitting: Physician Assistant

## 2015-08-09 ENCOUNTER — Encounter: Payer: Self-pay | Admitting: Physician Assistant

## 2015-08-09 VITALS — HR 76

## 2015-08-09 DIAGNOSIS — S0181XD Laceration without foreign body of other part of head, subsequent encounter: Secondary | ICD-10-CM

## 2015-08-09 NOTE — Progress Notes (Signed)
[facility] [address], [place] [id number] [phone]  Date:  08/09/2015   Name:  Gina Zavala   DOB:  03/29/1922   MRN:  [id number]  PCP:  Tonye PearsonOLITTLE, ROBERT P, MD    Chief Complaint: No chief complaint on file.   History of Present Illness:  This is a 81 y.o. female who is presenting for suture removal. She had a forehead laceration repaired in ED with #5 sutures on 08/02/15. She reports she is doing well, no significant pain. No drainage, fever or chills.  Review of Systems:  Review of Systems See HPI  Patient Active Problem List   Diagnosis Date Noted  . At risk for falling 09/01/2014  . Carotid bruit 01/14/2012  . Osteoporosis 01/14/2012  . HTN (hypertension) 09/01/2011  . Scoliosis 09/01/2011  . Hyponatremia 09/01/2011  . Uterine prolapse 09/01/2011    Prior to Admission medications   Medication Sig Start Date End Date Taking? Authorizing Provider  amLODipine (NORVASC) 5 MG tablet Take 1 tablet (5 mg total) by mouth daily. 09/20/14   Tonye Pearsonobert P Doolittle, MD  Polyethyl Glycol-Propyl Glycol (SYSTANE) 0.4-0.3 % SOLN Apply 1 drop to eye 4 (four) times daily.    Historical Provider, MD    No Known Allergies  Past Surgical History  Procedure Laterality Date  . Humerus fracture surgery Left     Social History  Substance Use Topics  . Smoking status: Former Smoker -- 0.30 packs/day for 1 years    Types: Cigarettes    Quit date: 01/13/1949  . Smokeless tobacco: None  . Alcohol Use: No    History reviewed. No pertinent family history.  Medication list has been reviewed and updated.  Physical Examination:  Physical Exam  Constitutional: She is oriented to person, place, and time. She appears well-developed and well-nourished. No distress.  HENT:  Head: Normocephalic and atraumatic.  Right Ear: Hearing normal.  Left Ear: Hearing normal.  Nose: Nose normal.  Eyes: Conjunctivae and lids are normal. Right eye exhibits no discharge.  Left eye exhibits no discharge. No scleral icterus.  Pulmonary/Chest: Effort normal. No respiratory distress.  Musculoskeletal: Normal range of motion.  Neurological: She is alert and oriented to person, place, and time.  Skin: Skin is warm, dry and intact.  3 cm laceration on right side forehead with #5 sutures intact. No erythema, drainage, tenderness. #5 sutures removed. Scabs removed with soap and water.  Psychiatric: She has a normal mood and affect. Her speech is normal and behavior is normal. Thought content normal.   Pulse 76  SpO2 94%  Pt did not want to be fully triaged  Assessment and Plan:  1. Laceration of face, subsequent encounter Sutures placed in ED. Wound healing well. Sutures removed. Wound care discussed. Return as needed.   Roswell MinersNicole V. Dyke BrackettBush, PA-C, MHS [facility]  08/09/2015

## 2015-08-10 ENCOUNTER — Ambulatory Visit: Payer: Medicare Other | Admitting: Internal Medicine

## 2015-08-20 ENCOUNTER — Encounter: Payer: Self-pay | Admitting: Internal Medicine

## 2015-08-21 ENCOUNTER — Telehealth: Payer: Self-pay

## 2015-08-21 NOTE — Telephone Encounter (Signed)
[facility] to Gina Zavala for approval. I have put the order form in Gina Zavala's box.

## 2015-08-22 ENCOUNTER — Encounter: Payer: Self-pay | Admitting: Internal Medicine

## 2015-08-22 ENCOUNTER — Ambulatory Visit (INDEPENDENT_AMBULATORY_CARE_PROVIDER_SITE_OTHER): Payer: Medicare Other | Admitting: Internal Medicine

## 2015-08-22 VITALS — HR 88 | Temp 98.2°F | Resp 16

## 2015-08-22 DIAGNOSIS — Z9181 History of falling: Secondary | ICD-10-CM

## 2015-08-22 DIAGNOSIS — Z23 Encounter for immunization: Secondary | ICD-10-CM

## 2015-08-22 DIAGNOSIS — I10 Essential (primary) hypertension: Secondary | ICD-10-CM

## 2015-08-22 DIAGNOSIS — E871 Hypo-osmolality and hyponatremia: Secondary | ICD-10-CM | POA: Diagnosis not present

## 2015-08-22 NOTE — Patient Instructions (Addendum)
Orders for vitamins: Centrum silver one every other day 3 times a week. Biotin=B12 1000 mcg once a day but optional for less B-1 100mg  Once a day but optional for less Lutein 40mg  one a day Lutein marigold 20mg  once a day

## 2015-08-24 NOTE — Progress Notes (Signed)
Subjective:    Patient ID: Gina Zavala, female    DOB: 04/21/1922, 81 y.o.   MRN: [id number]  HPI here for follow-up for hypertension, health maintenance, and recent emergency room visit Patient Active Problem List   Diagnosis Date Noted  . At risk for falling----she uses a walker because of balance issues. Her recent fall resulting in a head injury needing laceration repair was because she tried to adjust that he in her bedroom and was "tripped by my roommate" and fell into the radiator.  There is no brain injury, loss of consciousness, and scan was normal.  09/01/2014  . Carotid bruit 01/14/2012  . Osteoporosis 01/14/2012  . HTN (hypertension)--- note her emergency room presentation had a very high systolic pressure. She had not taken her medication that day. She was administered her 2.5 mg amlodipine in the emergency room and within 30 minutes her blood pressure had dropped to systolic of 70. She continues to be argumentative about anyone checking her blood pressure but will continue to take a low-dose of medication. She obviously has systolic hypertension of the elderly.  09/01/2011  . Scoliosis 09/01/2011  . Hyponatremia--stable from recent blood work  09/01/2011  . Uterine prolapse--surgery is being planned  09/01/2011   Outpatient Encounter Prescriptions as of 08/22/2015  Medication Sig Note  . acetaminophen (TYLENOL) 500 MG tablet Take 500 mg by mouth every 4 (four) hours as needed. 08/22/2015: 1/2 TABLET EQUAL 250 MG   . amLODipine (NORVASC) 5 MG tablet Take 1 tablet (5 mg total) by mouth daily.   Bertram Gala Glycol-Propyl Glycol (SYSTANE) 0.4-0.3 % SOLN Apply 1 drop to eye 4 (four) times daily.    No facility-administered encounter medications on file as of 08/22/2015.   She is in good spirits today although is still having arguments with her nursing home about taking her her own vitamins. They continue to be concerned that she will overdose on vitamins. I continue to be  concerned that they do not allow her enough independent thinking.  Many of her prior immunizations are in the paper chart and were given by her physician prior to her becoming part of this practice. No doubt she needs Prevnar.    Review of Systems No fever chills or night sweats No weight loss No chest pain or palpitations No peripheral edema Her uterine prolapse is producing incontinence now and she has follow-up with Dr. Ambrose Mantle No fatigue or change in energy level Not complaining of changes in cognitive function    Objective:   Physical Exam BP   Pulse 88  Temp(Src) 98.2 F (36.8 C) (Oral)  Resp 16  Ht   Wt  HEENT clear Laceration scar well healed Heart regular without murmur Stable carotid bruit Extremities with good pulses and adequate feeling Cranial nerves II through XII intact Gait stable with a walker Spine reveals bad kyphoscoliosis as in the past Her mood is good Thought content appears normal      Assessment & Plan:  Essential hypertension  At risk for falling  Hyponatremia   No change in current medications Patient Instructions  Orders for vitamins: Centrum silver one every other day 3 times a week. Biotin=B12 1000 mcg once a day but optional for less B-1  Once a day but optional for less Lutein  one a day Lutein marigold  once a day   Follow-up in 6-8 weeks at her request She would like to argue for a return to living at home with or without help  I feel like she would need around-the-clock assistance although she is very independent This continues to be a point for family discussion

## 2015-08-27 DIAGNOSIS — N8111 Cystocele, midline: Secondary | ICD-10-CM | POA: Diagnosis not present

## 2015-08-30 ENCOUNTER — Telehealth: Payer: Self-pay

## 2015-08-30 NOTE — Telephone Encounter (Signed)
Orders were dropped off for Dr Merla Richesoolittle to sign.I am placing them in his mailbox. Please call Carmella to pick up when ready [phone]. Thank you

## 2015-09-03 DIAGNOSIS — R260 Ataxic gait: Secondary | ICD-10-CM | POA: Diagnosis not present

## 2015-09-03 DIAGNOSIS — M79673 Pain in unspecified foot: Secondary | ICD-10-CM | POA: Diagnosis not present

## 2015-09-03 DIAGNOSIS — B351 Tinea unguium: Secondary | ICD-10-CM | POA: Diagnosis not present

## 2015-09-03 DIAGNOSIS — L600 Ingrowing nail: Secondary | ICD-10-CM | POA: Diagnosis not present

## 2015-09-12 ENCOUNTER — Ambulatory Visit: Payer: Medicare Other | Admitting: Internal Medicine

## 2015-09-21 ENCOUNTER — Telehealth: Payer: Self-pay

## 2015-09-21 NOTE — Telephone Encounter (Signed)
[facility] for Dr Doolittle's completion. I will put in his box.

## 2015-10-19 ENCOUNTER — Ambulatory Visit: Payer: Self-pay | Admitting: Internal Medicine

## 2015-10-23 ENCOUNTER — Telehealth: Payer: Self-pay

## 2015-10-23 NOTE — Telephone Encounter (Addendum)
Camilla from [facility] states pt goes behind her back and cancels her appt without her knowing. She is the one who have to bring her to appts so really would like to have a call back From [place] whenever we schedule her for an appt. Please call [phone]

## 2015-10-24 ENCOUNTER — Ambulatory Visit: Payer: Medicare Other | Admitting: Internal Medicine

## 2015-10-29 NOTE — Telephone Encounter (Signed)
LMVM for Gina Zavala to call back to schedule an appointment for Gina Zavala.

## 2015-11-05 DIAGNOSIS — B070 Plantar wart: Secondary | ICD-10-CM | POA: Diagnosis not present

## 2015-11-05 DIAGNOSIS — B351 Tinea unguium: Secondary | ICD-10-CM | POA: Diagnosis not present

## 2015-11-05 DIAGNOSIS — L851 Acquired keratosis [keratoderma] palmaris et plantaris: Secondary | ICD-10-CM | POA: Diagnosis not present

## 2015-11-05 DIAGNOSIS — M79673 Pain in unspecified foot: Secondary | ICD-10-CM | POA: Diagnosis not present

## 2015-11-12 DIAGNOSIS — N813 Complete uterovaginal prolapse: Secondary | ICD-10-CM | POA: Diagnosis not present

## 2015-11-12 DIAGNOSIS — N8111 Cystocele, midline: Secondary | ICD-10-CM | POA: Diagnosis not present

## 2015-11-13 ENCOUNTER — Ambulatory Visit (INDEPENDENT_AMBULATORY_CARE_PROVIDER_SITE_OTHER): Payer: Medicare Other | Admitting: Internal Medicine

## 2015-11-13 VITALS — HR 68 | Resp 18 | Ht <= 58 in | Wt <= 1120 oz

## 2015-11-13 DIAGNOSIS — N812 Incomplete uterovaginal prolapse: Secondary | ICD-10-CM | POA: Diagnosis not present

## 2015-11-13 DIAGNOSIS — N949 Unspecified condition associated with female genital organs and menstrual cycle: Secondary | ICD-10-CM | POA: Diagnosis not present

## 2015-11-13 NOTE — Progress Notes (Signed)
   Subjective:    Patient ID: Gina Zavala, female    DOB: 1922/02/05, 82 y.o.   MRN: [id number] By signing my name below, I, Littie Deeds, attest that this documentation has been prepared under the direction and in the presence of Ellamae Sia, MD.  Electronically Signed: Littie Deeds, Medical Scribe. 11/13/2015. 10:33 AM.  HPI HPI Comments: Gina Zavala is a 82 y.o. female with a history of uterine prolapse who [facility] complaining of dysuria yesterday. Patient notes she had her pessary taken out yesterday by Dr. Ambrose Mantle, which caused her some pain. She was told to follow-up in 3 months. Some general perineal discomfort sitting.  Patient notes she still has some pain to the right side of her forehead, where she has a scar from a previous laceration.  Patient eats generally well and likes to eat sardines, soy milk, avocados, spinach, beans, onions, bell peppers, oranges, grapefruit, blueberries, nuts, peanut butter, and bread with olive oil. She dislikes sweets and salty foods.  See hx HTN with Hx of hypotens episodes ? Related to fall/syncope. She generally avoids anyone taking her BP.  Lives in nurs facility  Review of Systems She has no dysuria or hematuria and has not had prolapse overnight No falls since head injury where fall was after tripping on roommates wheel chair    Objective:   Physical Exam  Constitutional: She is oriented to person, place, and time. She appears well-developed and well-nourished. No distress.  HENT:  Head: Normocephalic and atraumatic.  Scar healed R forehead  Eyes: EOM are normal. Pupils are equal, round, and reactive to light.  Neck: Neck supple.  Cardiovascular: Normal rate and regular rhythm.   Pulmonary/Chest: Effort normal.  Musculoskeletal: She exhibits no edema.  Neurological: She is alert and oriented to person, place, and time. No cranial nerve deficit.  Skin: Skin is warm and dry.  Psychiatric: She  has a normal mood and affect. Her behavior is normal.  Having anxiety and tears when we discuss transition to new PCP  Nursing note and vitals reviewed. BP   Pulse 68  Temp(Src)   Resp 18  Ht  (1.473 m)  Wt 68 lb (30.845 kg)  BMI 14.22 kg/m2  SpO2 96% Wt Readings from Last 3 Encounters:  11/13/15 68 lb (30.845 kg)  07/25/15 70 lb 9.6 oz (32.024 kg)  05/23/15 71 lb 9.6 oz (32.478 kg)       Assessment & Plan:  Hx ut prolapse -stable Scar forehead  Reassured re both F/u for hypertens in 6-8 weeks as usual  I have completed the patient encounter in its entirety as documented by the scribe, with editing by me where necessary. Jerauld Bostwick P. Merla Riches, M.D.

## 2015-11-14 DIAGNOSIS — N8111 Cystocele, midline: Secondary | ICD-10-CM | POA: Diagnosis not present

## 2015-11-30 ENCOUNTER — Telehealth: Payer: Self-pay | Admitting: Family Medicine

## 2015-11-30 NOTE — Telephone Encounter (Signed)
Called [facility] to let her know Dr. Merla Richesoolittle has completed medication review report and is ready for p/u.

## 2015-12-17 DIAGNOSIS — N814 Uterovaginal prolapse, unspecified: Secondary | ICD-10-CM | POA: Diagnosis not present

## 2015-12-17 DIAGNOSIS — N8111 Cystocele, midline: Secondary | ICD-10-CM | POA: Diagnosis not present

## 2016-01-02 ENCOUNTER — Ambulatory Visit (INDEPENDENT_AMBULATORY_CARE_PROVIDER_SITE_OTHER): Payer: Medicare Other | Admitting: Internal Medicine

## 2016-01-02 ENCOUNTER — Encounter: Payer: Self-pay | Admitting: Internal Medicine

## 2016-01-02 VITALS — HR 72 | Temp 98.1°F | Resp 18 | Ht <= 58 in | Wt 72.0 lb

## 2016-01-02 DIAGNOSIS — I10 Essential (primary) hypertension: Secondary | ICD-10-CM

## 2016-01-02 DIAGNOSIS — E871 Hypo-osmolality and hyponatremia: Secondary | ICD-10-CM | POA: Diagnosis not present

## 2016-01-02 DIAGNOSIS — Z9181 History of falling: Secondary | ICD-10-CM

## 2016-01-02 NOTE — Patient Instructions (Signed)
     IF you received an x-ray today, you will receive an invoice from [facility]. Please contact [facility] at [phone] with questions or concerns regarding your invoice.   IF you received labwork today, you will receive an invoice from [facility]. Please contact [facility] at [phone] with questions or concerns regarding your invoice.   Our billing staff will not be able to assist you with questions regarding bills from these companies.  You will be contacted with the lab results as soon as they are available. The fastest way to get your results is to activate your My Chart account. Instructions are located on the last page of this paperwork. If you have not heard from us regarding the results in 2 weeks, please contact this office.      

## 2016-01-04 NOTE — Progress Notes (Signed)
Chief Complaint  Patient presents with  . Follow-up  . Hypertension   This 82 year old patient is mainly here to discuss follow-up with a new primary provider. She is very angry at me for retiring. She is very tearful during the visit. I can understand her feelings of abandonment but hope she recognizes that it is time for this transition.  She has been stable medically for the past 2 months without further falls. Continues to refuse to have her blood pressure taken. sHe continues to be as independent as anyone could be assisted living.  Outside records from [facility] are reviewed / their medication lists are current She is asking to discontinue Systane eyedrops certainly okay  Health maintenance issues up-to-date  She continues to write  Exam She is feisty and verbal as usual BP   Pulse 72  Temp(Src) 98.1 F (36.7 C)  Resp 18  Ht 4\' 10"  (1.473 m)  Wt 72 lb (32.659 kg)  BMI 15.05 kg/m2 Wt Readings from Last 3 Encounters:  01/02/16 72 lb (32.659 kg)  11/13/15 68 lb (30.845 kg)  07/25/15 70 lb 9.6 oz (32.024 kg)  PERRLA with EOMs conjugate Vision stable Hearing failure Very quiet carotid bruit on the right Heart regular without murmur Lungs clear No peripheral edema Cranial nerves II through XII intact  Impression Essential hypertension  At risk for falling  Hyponatremia  Plan -Continue Norvasc 5 mg  We will arrange follow-up by phone in 1-2 months with a new primary care provider--she is adamant that she prefers a female

## 2016-01-11 ENCOUNTER — Ambulatory Visit (INDEPENDENT_AMBULATORY_CARE_PROVIDER_SITE_OTHER): Payer: Medicare Other | Admitting: Internal Medicine

## 2016-01-11 VITALS — HR 74 | Temp 97.5°F | Resp 16 | Ht <= 58 in | Wt 75.2 lb

## 2016-01-11 DIAGNOSIS — M25571 Pain in right ankle and joints of right foot: Secondary | ICD-10-CM

## 2016-01-11 NOTE — Progress Notes (Signed)
   Subjective:  By signing my name below, I, Raven Small, attest that this documentation has been prepared under the direction and in the presence of Ellamae Siaobert Doolittle, MD.  Electronically Signed: Andrew Auaven Small, ED Scribe. 01/11/2016. 11:20 AM.  Patient ID: Gina Zavala, female    DOB: 03/20/1922, 82 y.o.   MRN: [id number]  HPI   Chief Complaint  Patient presents with  . Ankle Injury    Swelling in right ankle x 3 days    HPI Comments: Gina Zavala is a 82 y.o. female who [facility] complaining of a right ankle pain that began 3 days ago. Pt's had some mild swelling in R foot. Pt has not taken pain medication. Pt ambulates with a walker. This has not slowed her down. No certain injury.  Pt also c/o of both ears being clogged due to allergies.   Pt is would also like some suggestion for a new PCP.  Here with soninlaw Alcide Eveneroger Kleisch.  Patient Active Problem List   Diagnosis Date Noted  . At risk for falling 09/01/2014  . Carotid bruit 01/14/2012  . Osteoporosis 01/14/2012  . HTN (hypertension) 09/01/2011  . Scoliosis 09/01/2011  . Hyponatremia 09/01/2011  . Uterine prolapse 09/01/2011   Past Medical History  Diagnosis Date  . Hypertension   . Hypo-osmolality and hyponatremia    Past Surgical History  Procedure Laterality Date  . Humerus fracture surgery Left    No Known Allergies Prior to Admission medications   Medication Sig Start Date End Date Taking? Authorizing Provider  acetaminophen (TYLENOL) 500 MG tablet Take 500 mg by mouth every 4 (four) hours as needed. Reported on 11/13/2015   Yes Historical Provider, MD  amLODipine (NORVASC) 5 MG tablet Take 1 tablet (5 mg total) by mouth daily. 09/20/14  Yes Tonye Pearsonobert P Doolittle, MD  Biotin 1 MG CAPS Take by mouth.   Yes Historical Provider, MD  multivitamin-lutein (OCUVITE-LUTEIN) CAPS capsule Take 1 capsule by mouth daily.   Yes Historical Provider, MD  Polyethyl Glycol-Propyl Glycol (SYSTANE)  0.4-0.3 % SOLN Apply 1 drop to eye 4 (four) times daily. Reported on 11/13/2015   Yes Historical Provider, MD   Review of Systems  Musculoskeletal: Positive for myalgias.  Allergic/Immunologic: Positive for environmental allergies.   Objective:   Physical Exam  Constitutional: She appears well-developed and well-nourished. No distress.  HENT:  Head: Normocephalic and atraumatic.  Eyes: Conjunctivae and EOM are normal.  Neck: Neck supple.  Pulmonary/Chest: Effort normal.  Musculoskeletal: Normal range of motion.  Minimal swelling of R lat ankle over ATF without bruising or pain to rom.  Neurological: She is alert.  Skin: Skin is warm and dry.  Psychiatric: She has a normal mood and affect. Her behavior is normal.  Nursing note and vitals reviewed.  Filed Vitals:   01/11/16 1038  Pulse: 74  Temp: 97.5 F (36.4 C)  TempSrc: Oral  Resp: 16  Height: 4\' 10"  (1.473 m)  Weight: 75 lb 3.2 oz (34.11 kg)  SpO2: 96%   refused BP as usual.. Assessment & Plan:  Mild ankle sprain  reassured  Will find appropriate PCP with help of family--female only Needs f/u in about 2 months  I have completed the patient encounter in its entirety as documented by the scribe, with editing by me where necessary. Robert P. Merla Richesoolittle, M.D.

## 2016-01-11 NOTE — Patient Instructions (Signed)
     IF you received an x-ray today, you will receive an invoice from [facility]. Please contact [facility] at [phone] with questions or concerns regarding your invoice.   IF you received labwork today, you will receive an invoice from [facility]. Please contact [facility] at [phone] with questions or concerns regarding your invoice.   Our billing staff will not be able to assist you with questions regarding bills from these companies.  You will be contacted with the lab results as soon as they are available. The fastest way to get your results is to activate your My Chart account. Instructions are located on the last page of this paperwork. If you have not heard from us regarding the results in 2 weeks, please contact this office.      

## 2016-02-21 ENCOUNTER — Encounter: Payer: Self-pay | Admitting: Internal Medicine

## 2016-03-04 ENCOUNTER — Telehealth: Payer: Self-pay

## 2016-03-04 NOTE — Telephone Encounter (Signed)
Received [facility]. I have put them in Dr Doolittle's box.

## 2016-04-04 ENCOUNTER — Telehealth: Payer: Self-pay

## 2016-04-04 NOTE — Telephone Encounter (Signed)
Received faxed orders for Dr Merla R[facility]soolittle to sign from [facility]. I will put them in his box for review by covering provider.

## 2016-04-09 DIAGNOSIS — R4182 Altered mental status, unspecified: Secondary | ICD-10-CM | POA: Diagnosis not present

## 2016-04-11 ENCOUNTER — Other Ambulatory Visit: Payer: Self-pay | Admitting: Physician Assistant

## 2016-04-22 ENCOUNTER — Telehealth: Payer: Self-pay

## 2016-04-22 NOTE — Telephone Encounter (Signed)
Dr. Merla Riches referred patient to Dr. Kevan Ny for primary care at [facility]. The office is requesting Lizeth Wees medical records. Fax number [phone].

## 2016-04-24 NOTE — Telephone Encounter (Signed)
I am faxing over a medical release form to Dr. Kevan Ny at [facility].

## 2016-05-16 ENCOUNTER — Telehealth: Payer: Self-pay

## 2016-05-16 DIAGNOSIS — N390 Urinary tract infection, site not specified: Secondary | ICD-10-CM | POA: Diagnosis not present

## 2016-05-16 NOTE — Telephone Encounter (Signed)
Gina DandyMary has just been completed a round of antibiotics for a UTI. She completed the antibiotics 10 days ago. Artist PaisShawna would like an order for an UA and CNS to recheck for UTI. Please fax to #[phone]

## 2016-05-16 NOTE — Telephone Encounter (Signed)
Ca we order? Dr. Merla Richesoolittle pt.

## 2016-05-16 NOTE — Telephone Encounter (Signed)
I would not run a test of cure unless she is having symptoms.  It is possible that her urine will still look infected despite the 10 days. Also I do not see that we treated her for a UTI.  Gina BostonMichael Larrissa Stivers, MS, PA-C 3:00 PM, 05/16/2016

## 2016-05-16 NOTE — Telephone Encounter (Signed)
Gina Zavala is checking on this message, she would like to get a sample before she leaves. CB [phone]

## 2016-05-17 NOTE — Telephone Encounter (Signed)
We would need to see her since we did not treat her UTI. I called and left message for facility to call back so we can advise.

## 2016-05-20 NOTE — Telephone Encounter (Signed)
If the patient is symptomatic, I will OK a UCx + sensitivities.  If not, I would not recommend that.  I would also appreciate receiving a copy of the results of the previous test that I signed for.

## 2016-05-20 NOTE — Telephone Encounter (Signed)
[facility] called back and stated that pt is having symptoms still of mild flank pain and increased confusion. Pt is also staying in bed and "not up and around as usual". Chelle, I see that you signed the orders for a UA to be done in July. Do you want to send order for another to check to see if the UA has completely resolved? [facility] requesting that a written order be faxed for a UA culture and sensitivity.

## 2016-05-27 ENCOUNTER — Telehealth: Payer: Self-pay

## 2016-05-27 NOTE — Telephone Encounter (Signed)
The lovely Gina Zavala called stating Dr. Merla Zavala wrote a letter stating Gina Zavala to see Dr. Marden Nobleobert Zavala as her Primary Care doctor. I called [facility] to schedule an appointement with Dr. Kevan Zavala. The receptionist stated Gina Zavala was previous seeing Dr. Donette Zavala and she will have to get authorization in [facility]ev[facility]. Gina Zavala requested to been seen within a month. The receptionist stated she will call back because Dr. Kevan Zavala will not be in the office until the on the 11th. Please call Gina Zavala to explain the process or recommend another doctor for primary care. Maybe call Dr. Marden Nobleobert Zavala office to discuss other options.

## 2016-05-28 DIAGNOSIS — N8111 Cystocele, midline: Secondary | ICD-10-CM | POA: Diagnosis not present

## 2016-05-28 DIAGNOSIS — R350 Frequency of micturition: Secondary | ICD-10-CM | POA: Diagnosis not present

## 2016-05-28 NOTE — Telephone Encounter (Signed)
Lm to call back

## 2016-05-29 NOTE — Telephone Encounter (Signed)
Patient called back regarding establishing primary care with new dr. Would like a call back with more information regarding when her apt will be made

## 2016-06-02 NOTE — Telephone Encounter (Signed)
Called pt regarding need for an appointment. Message left on voice mail

## 2016-06-03 NOTE — Telephone Encounter (Addendum)
Dr Judithann SaugerGate's office called back and stated that it would be after the beginning of the year before pt could be seen by him. She asked if there is a reason pt can not see Dr Rene PaciHussein. I advised that I don't know the answer to that, I believe Dr Merla Richesoolittle had recommended Dr Kevan NyGates and that was why pt asked for him. She will call pt and ask her what she would like to do and I provided the numbers I have.

## 2016-06-03 NOTE — Telephone Encounter (Signed)
Called and spoke to the woman who schedules pt's appts and provides transportation at her [facility]st living and was unsure who to speak to to follow up with getting appt w/Dr Kevan NyGates. I called Dr Judithann [facility]aug[facility]Gate's [facility] and [facility] for his secretary to explaine the situation and ask if we need to do anything else or if they will contact the pt after asking for the OK from Dr Kevan NyGates to schedule pt? Left the the correct # to call at asst living to get appt sch for pt and asked them to call me back if anything else is needed from us.

## 2016-06-06 ENCOUNTER — Other Ambulatory Visit: Payer: Self-pay

## 2016-06-06 MED ORDER — SULFAMETHOXAZOLE-TRIMETHOPRIM 800-160 MG PO TABS: 1.0000 | ORAL_TABLET | Freq: Two times a day (BID) | ORAL | 0 refills | Status: DC

## 2016-06-06 NOTE — Telephone Encounter (Signed)
Urine results from 05/16/16 received on fax dated 12/26/2011.  POSITIVE Culture for E coli, >100,000 colony forming units. Please contact [facility]. Has patient established with new PCP (Dr. Kevan NyGates or Dr. Rene PaciHussein?). If not, Septra ds 1 PO BID x 5 days, #10, no refills

## 2016-06-06 NOTE — Telephone Encounter (Signed)
Voice message left for antibiotic septra bid for 5 days

## 2016-07-08 ENCOUNTER — Telehealth: Payer: Self-pay

## 2016-07-08 NOTE — Telephone Encounter (Signed)
Patient is calling to see if we can write her a letter allowing her to get her flu [facility]. Patient states that she needs it soon as possible. Please advise! Patient phone: [phone]  [facility] Phone:[phone] Fax: [phone]

## 2016-07-11 NOTE — Telephone Encounter (Signed)
Called [facility] to verify that pt. Only needed a letter stating she can receive a flu [facility]. [facility] printed [facility].

## 2016-07-17 ENCOUNTER — Ambulatory Visit: Payer: Medicare Other | Admitting: Family Medicine

## 2016-07-19 ENCOUNTER — Ambulatory Visit (INDEPENDENT_AMBULATORY_CARE_PROVIDER_SITE_OTHER): Payer: Medicare Other | Admitting: Urgent Care

## 2016-07-19 DIAGNOSIS — Z23 Encounter for immunization: Secondary | ICD-10-CM | POA: Diagnosis not present

## 2016-07-24 ENCOUNTER — Ambulatory Visit: Payer: Medicare Other

## 2016-07-30 ENCOUNTER — Ambulatory Visit: Payer: Medicare Other

## 2016-07-31 ENCOUNTER — Ambulatory Visit: Payer: Medicare Other

## 2016-08-07 DIAGNOSIS — Q845 Enlarged and hypertrophic nails: Secondary | ICD-10-CM | POA: Diagnosis not present

## 2016-08-07 DIAGNOSIS — M2010 Hallux valgus (acquired), unspecified foot: Secondary | ICD-10-CM | POA: Diagnosis not present

## 2016-08-07 DIAGNOSIS — I739 Peripheral vascular disease, unspecified: Secondary | ICD-10-CM | POA: Diagnosis not present

## 2016-08-07 DIAGNOSIS — L853 Xerosis cutis: Secondary | ICD-10-CM | POA: Diagnosis not present

## 2016-08-07 DIAGNOSIS — L603 Nail dystrophy: Secondary | ICD-10-CM | POA: Diagnosis not present

## 2016-08-07 DIAGNOSIS — B351 Tinea unguium: Secondary | ICD-10-CM | POA: Diagnosis not present

## 2016-08-25 DIAGNOSIS — R54 Age-related physical debility: Secondary | ICD-10-CM | POA: Diagnosis not present

## 2016-08-25 DIAGNOSIS — R269 Unspecified abnormalities of gait and mobility: Secondary | ICD-10-CM | POA: Diagnosis not present

## 2016-08-25 DIAGNOSIS — M4120 Other idiopathic scoliosis, site unspecified: Secondary | ICD-10-CM | POA: Diagnosis not present

## 2016-08-25 DIAGNOSIS — M5432 Sciatica, left side: Secondary | ICD-10-CM | POA: Diagnosis not present

## 2016-08-25 DIAGNOSIS — I10 Essential (primary) hypertension: Secondary | ICD-10-CM | POA: Diagnosis not present

## 2016-08-25 DIAGNOSIS — F411 Generalized anxiety disorder: Secondary | ICD-10-CM | POA: Diagnosis not present

## 2016-08-27 DIAGNOSIS — N8111 Cystocele, midline: Secondary | ICD-10-CM | POA: Diagnosis not present

## 2016-11-21 DIAGNOSIS — N8111 Cystocele, midline: Secondary | ICD-10-CM | POA: Diagnosis not present

## 2016-11-28 DIAGNOSIS — Z4689 Encounter for fitting and adjustment of other specified devices: Secondary | ICD-10-CM | POA: Diagnosis not present

## 2016-11-28 DIAGNOSIS — M5432 Sciatica, left side: Secondary | ICD-10-CM | POA: Diagnosis not present

## 2016-11-28 DIAGNOSIS — Z79899 Other long term (current) drug therapy: Secondary | ICD-10-CM | POA: Diagnosis not present

## 2016-11-28 DIAGNOSIS — R32 Unspecified urinary incontinence: Secondary | ICD-10-CM | POA: Diagnosis not present

## 2016-11-28 DIAGNOSIS — F411 Generalized anxiety disorder: Secondary | ICD-10-CM | POA: Diagnosis not present

## 2016-11-28 DIAGNOSIS — R54 Age-related physical debility: Secondary | ICD-10-CM | POA: Diagnosis not present

## 2016-11-28 DIAGNOSIS — Z1389 Encounter for screening for other disorder: Secondary | ICD-10-CM | POA: Diagnosis not present

## 2016-11-28 DIAGNOSIS — N814 Uterovaginal prolapse, unspecified: Secondary | ICD-10-CM | POA: Diagnosis not present

## 2016-11-28 DIAGNOSIS — E538 Deficiency of other specified B group vitamins: Secondary | ICD-10-CM | POA: Diagnosis not present

## 2016-11-28 DIAGNOSIS — I10 Essential (primary) hypertension: Secondary | ICD-10-CM | POA: Diagnosis not present

## 2016-11-28 DIAGNOSIS — Z0000 Encounter for general adult medical examination without abnormal findings: Secondary | ICD-10-CM | POA: Diagnosis not present

## 2016-11-28 DIAGNOSIS — R269 Unspecified abnormalities of gait and mobility: Secondary | ICD-10-CM | POA: Diagnosis not present

## 2016-11-28 DIAGNOSIS — M4120 Other idiopathic scoliosis, site unspecified: Secondary | ICD-10-CM | POA: Diagnosis not present

## 2016-12-18 ENCOUNTER — Ambulatory Visit: Payer: Medicare Other | Admitting: Podiatry

## 2016-12-19 DIAGNOSIS — R300 Dysuria: Secondary | ICD-10-CM | POA: Diagnosis not present

## 2016-12-19 DIAGNOSIS — I10 Essential (primary) hypertension: Secondary | ICD-10-CM | POA: Diagnosis not present

## 2016-12-19 DIAGNOSIS — E871 Hypo-osmolality and hyponatremia: Secondary | ICD-10-CM | POA: Diagnosis not present

## 2016-12-22 ENCOUNTER — Ambulatory Visit (INDEPENDENT_AMBULATORY_CARE_PROVIDER_SITE_OTHER): Payer: Medicare Other | Admitting: Podiatry

## 2016-12-22 ENCOUNTER — Ambulatory Visit (INDEPENDENT_AMBULATORY_CARE_PROVIDER_SITE_OTHER): Payer: Medicare Other

## 2016-12-22 DIAGNOSIS — L608 Other nail disorders: Secondary | ICD-10-CM

## 2016-12-22 DIAGNOSIS — M79609 Pain in unspecified limb: Secondary | ICD-10-CM | POA: Diagnosis not present

## 2016-12-22 DIAGNOSIS — M79671 Pain in right foot: Secondary | ICD-10-CM

## 2016-12-22 DIAGNOSIS — M79672 Pain in left foot: Secondary | ICD-10-CM

## 2016-12-22 DIAGNOSIS — L603 Nail dystrophy: Secondary | ICD-10-CM

## 2016-12-22 DIAGNOSIS — B351 Tinea unguium: Secondary | ICD-10-CM

## 2016-12-22 NOTE — Progress Notes (Signed)
   SUBJECTIVE Patient  presents to office today complaining of elongated, thickened nails. Pain while ambulating in shoes. Patient is unable to trim their own nails.   OBJECTIVE General Patient is awake, alert, and oriented x 3 and in no acute distress. Derm Skin is dry and supple bilateral. Negative open lesions or macerations. Remaining integument unremarkable. Nails are tender, long, thickened and dystrophic with subungual debris, consistent with onychomycosis, 1-5 bilateral. No signs of infection noted. Vasc  DP and PT pedal pulses palpable bilaterally. Temperature gradient within normal limits.  Neuro Epicritic and protective threshold sensation diminished bilaterally.  Musculoskeletal Exam No symptomatic pedal deformities noted bilateral. Muscular strength within normal limits.  ASSESSMENT 1. Onychodystrophic nails 1-5 bilateral with hyperkeratosis of nails.  2. Onychomycosis of nail due to dermatophyte bilateral 3. Pain in foot bilateral  PLAN OF CARE 1. Patient evaluated today.  2. Instructed to maintain good pedal hygiene and foot care.  3. Mechanical debridement of nails 1-5 bilaterally performed using a nail nipper. Filed with dremel without incident.  4. Return to clinic in 3 mos.    Leandro Berkowitz M. Willis Kuipers, DPM [facility]  Dr. Jakyria Bleau M. Earle Troiano, DPM    [address]                                        [place], [place] [address]                Office [phone]  Fax [phone]      

## 2017-02-03 ENCOUNTER — Telehealth: Payer: Self-pay | Admitting: Family Medicine

## 2017-02-03 NOTE — Telephone Encounter (Signed)
PT CALLING STATING THAT SHE HAS BEEN CRYING AND HAVENT EATEN IN 2 DAYS SHES WORRIED THAT HER VARICOSE VEINS WANT GO AWAY AND SHE IS VERY CONCERNED PLEASE RESPOND

## 2017-02-05 NOTE — Telephone Encounter (Signed)
Last seen 12/2015 dr Merla Richesdoolittle L/m at home # to call for an appt. Her cell # is a transport service she uses?

## 2017-02-25 ENCOUNTER — Encounter (HOSPITAL_COMMUNITY): Payer: Self-pay

## 2017-02-25 ENCOUNTER — Observation Stay (HOSPITAL_COMMUNITY)
Admission: EM | Admit: 2017-02-25 | Discharge: 2017-02-26 | Disposition: A | Discharge status: Home / Self Care | Payer: Medicare Other | Attending: Internal Medicine | Admitting: Internal Medicine

## 2017-02-25 ENCOUNTER — Emergency Department (HOSPITAL_COMMUNITY): Payer: Medicare Other

## 2017-02-25 DIAGNOSIS — Z87891 Personal history of nicotine dependence: Secondary | ICD-10-CM | POA: Insufficient documentation

## 2017-02-25 DIAGNOSIS — I10 Essential (primary) hypertension: Secondary | ICD-10-CM | POA: Insufficient documentation

## 2017-02-25 DIAGNOSIS — R112 Nausea with vomiting, unspecified: Secondary | ICD-10-CM | POA: Diagnosis present

## 2017-02-25 DIAGNOSIS — K529 Noninfective gastroenteritis and colitis, unspecified: Secondary | ICD-10-CM

## 2017-02-25 DIAGNOSIS — Z79899 Other long term (current) drug therapy: Secondary | ICD-10-CM | POA: Diagnosis not present

## 2017-02-25 DIAGNOSIS — J9811 Atelectasis: Secondary | ICD-10-CM | POA: Diagnosis not present

## 2017-02-25 DIAGNOSIS — R109 Unspecified abdominal pain: Secondary | ICD-10-CM | POA: Diagnosis not present

## 2017-02-25 DIAGNOSIS — E860 Dehydration: Principal | ICD-10-CM

## 2017-02-25 DIAGNOSIS — R1111 Vomiting without nausea: Secondary | ICD-10-CM | POA: Diagnosis not present

## 2017-02-25 LAB — CBC WITH DIFFERENTIAL/PLATELET
BASOS ABS: 0 10*3/uL (ref 0.0–0.1)
BASOS PCT: 0 %
EOS ABS: 0 10*3/uL (ref 0.0–0.7)
EOS PCT: 0 %
HCT: 39.2 % (ref 36.0–46.0)
Hemoglobin: 12.8 g/dL (ref 12.0–15.0)
Lymphocytes Relative: 7 %
Lymphs Abs: 1.2 10*3/uL (ref 0.7–4.0)
MCH: 28.8 pg (ref 26.0–34.0)
MCHC: 32.7 g/dL (ref 30.0–36.0)
MCV: 88.3 fL (ref 78.0–100.0)
Monocytes Absolute: 0.4 10*3/uL (ref 0.1–1.0)
Monocytes Relative: 3 %
Neutro Abs: 14.6 10*3/uL — ABNORMAL HIGH (ref 1.7–7.7)
Neutrophils Relative %: 90 %
PLATELETS: 424 10*3/uL — AB (ref 150–400)
RBC: 4.44 MIL/uL (ref 3.87–5.11)
RDW: 15.1 % (ref 11.5–15.5)
WBC: 16.3 10*3/uL — AB (ref 4.0–10.5)

## 2017-02-25 LAB — COMPREHENSIVE METABOLIC PANEL
ALK PHOS: 79 U/L (ref 38–126)
ALT: 14 U/L (ref 14–54)
ANION GAP: 9 (ref 5–15)
AST: 22 U/L (ref 15–41)
Albumin: 3.9 g/dL (ref 3.5–5.0)
BUN: 45 mg/dL — ABNORMAL HIGH (ref 6–20)
CALCIUM: 9.9 mg/dL (ref 8.9–10.3)
CHLORIDE: 96 mmol/L — AB (ref 101–111)
CO2: 31 mmol/L (ref 22–32)
CREATININE: 0.89 mg/dL (ref 0.44–1.00)
GFR calc non Af Amer: 54 mL/min — ABNORMAL LOW (ref >60)
Glucose, Bld: 166 mg/dL — ABNORMAL HIGH (ref 65–99)
Potassium: 4.5 mmol/L (ref 3.5–5.1)
SODIUM: 136 mmol/L (ref 135–145)
Total Bilirubin: 0.6 mg/dL (ref 0.3–1.2)
Total Protein: 7 g/dL (ref 6.5–8.1)

## 2017-02-25 LAB — LIPASE, BLOOD: LIPASE: 62 U/L — AB (ref 11–51)

## 2017-02-25 LAB — MRSA PCR SCREENING: MRSA BY PCR: NEGATIVE

## 2017-02-25 LAB — I-STAT CG4 LACTIC ACID, ED: Lactic Acid, Venous: 1.81 mmol/L (ref 0.5–1.9)

## 2017-02-25 LAB — POC OCCULT BLOOD, ED: FECAL OCCULT BLD: NEGATIVE

## 2017-02-25 MED ORDER — SULFAMETHOXAZOLE-TRIMETHOPRIM 800-160 MG PO TABS
1.0000 | ORAL_TABLET | Freq: Two times a day (BID) | ORAL | Status: DC
  Administered 2017-02-25: 1 via ORAL
  Filled 2017-02-25: qty 1

## 2017-02-25 MED ORDER — ACETAMINOPHEN 500 MG PO TABS: 500.0000 mg | ORAL_TABLET | ORAL | Status: DC | PRN

## 2017-02-25 MED ORDER — SODIUM CHLORIDE 0.9 % IV SOLN
INTRAVENOUS | Status: DC
Start: 2017-02-25 — End: 2017-02-26
  Administered 2017-02-25: 1000 mL via INTRAVENOUS

## 2017-02-25 MED ORDER — SODIUM CHLORIDE 0.9 % IV BOLUS (SEPSIS)
1000.0000 mL | Freq: Once | INTRAVENOUS | Status: AC
  Administered 2017-02-25: 1000 mL via INTRAVENOUS

## 2017-02-25 MED ORDER — ONDANSETRON HCL 4 MG PO TABS: 4.0000 mg | ORAL_TABLET | Freq: Four times a day (QID) | ORAL | Status: DC | PRN

## 2017-02-25 MED ORDER — ENSURE ENLIVE PO LIQD
237.0000 mL | Freq: Two times a day (BID) | ORAL | Status: DC
  Administered 2017-02-26: 237 mL via ORAL

## 2017-02-25 MED ORDER — ONDANSETRON HCL 4 MG/2ML IJ SOLN: 4.0000 mg | Freq: Four times a day (QID) | INTRAMUSCULAR | Status: DC | PRN

## 2017-02-25 MED ORDER — AMLODIPINE BESYLATE 5 MG PO TABS
5.0000 mg | ORAL_TABLET | Freq: Every day | ORAL | Status: DC
  Administered 2017-02-26: 5 mg via ORAL
  Filled 2017-02-25: qty 1

## 2017-02-25 NOTE — ED Triage Notes (Signed)
Per EMS- Patient is a resident of [facility]. Patient had a sudden onset of vomiting and diarrhea. Patient had 3 episodes of diarrhea and has not vomited since 100. Patient refused to have EMS take more than one set of VS.

## 2017-02-25 NOTE — ED Provider Notes (Signed)
[facility]-EMERGENCY DEPT Provider Note   CSN: [id number] Arrival date & time: 02/25/17  1551     History   Chief Complaint Chief Complaint  Patient presents with  . Emesis  . Diarrhea    HPI Gina Zavala is a 83 y.o. female.  HPI  83 year old female presents from her living facility with vomiting and diarrhea. Patient states this all started this morning and she thinks she ate something that caused it. She's had at least 3 episodes of loose watery stools and 3 episodes of emesis. She states her emesis looks like food but no blood in either the vomit or diarrhea. She denies any abdominal pain. No fevers, chest pain, or urinary symptoms. She states she feels faint. She states her mouth feels dry and she is asking for something to drink. Denies any recent antibiotics.  Past Medical History:  Diagnosis Date  . Hypertension   . Hypo-osmolality and hyponatremia     Patient Active Problem List   Diagnosis Date Noted  . Dehydration 02/25/2017  . At risk for falling 09/01/2014  . Carotid bruit 01/14/2012  . Osteoporosis 01/14/2012  . HTN (hypertension) 09/01/2011  . Scoliosis 09/01/2011  . Hyponatremia 09/01/2011  . Uterine prolapse 09/01/2011    Past Surgical History:  Procedure Laterality Date  . HUMERUS FRACTURE SURGERY Left     OB History    No data available       Home Medications    Prior to Admission medications   Medication Sig Start Date End Date Taking? Authorizing Provider  amLODipine (NORVASC) 5 MG tablet Take 1 tablet (5 mg total) by mouth daily. 09/20/14  Yes Tonye Pearson, MD  Multiple Vitamins-Minerals (CENTRUM SILVER 50+WOMEN) TABS Take 1 tablet by mouth as directed. MWF   Yes [provider]  acetaminophen (TYLENOL) 500 MG tablet Take 250 mg by mouth every 4 (four) hours as needed for moderate pain. Reported on 11/13/2015    [provider]  Biotin 1 MG CAPS Take by mouth.    [provider]  Polyethyl Glycol-Propyl Glycol  (SYSTANE) 0.4-0.3 % SOLN Apply 1 drop to eye 4 (four) times daily. Reported on 11/13/2015    [provider]    Family History No family history on file.  Social History Social History  Substance Use Topics  . Smoking status: Former Smoker    Packs/day: 0.30    Years: 1.00    Types: Cigarettes    Quit date: 01/13/1949  . Smokeless tobacco: Never Used  . Alcohol use No     Allergies   Patient has no known allergies.   Review of Systems Review of Systems  Constitutional: Negative for fever.  Respiratory: Negative for shortness of breath.   Cardiovascular: Negative for chest pain.  Gastrointestinal: Positive for diarrhea, nausea and vomiting. Negative for abdominal pain.  Genitourinary: Negative for dysuria.  Neurological: Positive for dizziness.  All other systems reviewed and are negative.    Physical Exam Updated Vital Signs BP (!) 167/79   Pulse (!) 110   Temp 98.4 F (36.9 C) (Oral)   Resp 20   SpO2 (!) 67%   Physical Exam  Constitutional: She is oriented to person, place, and time. She appears well-developed.  Frail, very thin  HENT:  Head: Normocephalic and atraumatic.  Right Ear: External ear normal.  Left Ear: External ear normal.  Nose: Nose normal.  Mouth/Throat: Mucous membranes are dry.  Eyes: Right eye exhibits no discharge. Left eye exhibits no discharge.  Cardiovascular: Normal rate, regular rhythm and normal heart sounds.   Pulses:      Radial pulses are 1+ on the right side, and 1+ on the left side.  Pulmonary/Chest: Effort normal and breath sounds normal.  Abdominal: Soft. She exhibits no distension. There is no tenderness.  Neurological: She is alert and oriented to person, place, and time.  Skin: Skin is warm and dry.  Nursing note and vitals reviewed.    ED Treatments / Results  Labs (all labs ordered are listed, but only abnormal results are displayed) Labs Reviewed  COMPREHENSIVE METABOLIC PANEL - Abnormal; Notable for  the following:       Result Value   Chloride 96 (*)    Glucose, Bld 166 (*)    BUN 45 (*)    GFR calc non Af Amer 54 (*)    All other components within normal limits  LIPASE, BLOOD - Abnormal; Notable for the following:    Lipase 62 (*)    All other components within normal limits  CBC WITH DIFFERENTIAL/PLATELET - Abnormal; Notable for the following:    WBC 16.3 (*)    Platelets 424 (*)    Neutro Abs 14.6 (*)    All other components within normal limits  GASTROINTESTINAL PANEL BY PCR, STOOL (REPLACES STOOL CULTURE)  GASTROINTESTINAL PANEL BY PCR, STOOL (REPLACES STOOL CULTURE)  URINALYSIS, ROUTINE W REFLEX MICROSCOPIC  I-STAT CG4 LACTIC ACID, ED  POC OCCULT BLOOD, ED    EKG  EKG Interpretation None     ED ECG REPORT   Date: 02/25/2017  Rate: 76  Rhythm: normal sinus rhythm  QRS Axis: left  Intervals: normal  ST/T Wave abnormalities: normal  Conduction Disutrbances:none  Narrative Interpretation: LVH, no acute ST/T changes  Old EKG Reviewed: unchanged  I have personally reviewed the EKG tracing and agree with the computerized printout as noted.   Radiology Dg Chest Portable 1 View  Result Date: 02/25/2017 CLINICAL DATA:  Hypotension EXAM: PORTABLE CHEST 1 VIEW COMPARISON:  08/02/2015 FINDINGS: Cardiac shadow is stable. Aortic calcifications are again seen. Bibasilar atelectasis is noted. No focal confluent infiltrate is seen. Scoliosis of the thoracic spine is noted. IMPRESSION: Bibasilar atelectasis.  No other focal abnormality is noted. Electronically Signed   By: Alcide CleverMark  Lukens M.D.   On: 02/25/2017 16:55    Procedures Procedures (including critical care time)  Medications Ordered in ED Medications  sodium chloride 0.9 % bolus 1,000 mL (1,000 mLs Intravenous New Bag/Given 02/25/17 1620)     Initial Impression / Assessment and Plan / ED Course  I have reviewed the triage vital signs and the nursing notes.  Pertinent labs & imaging results that were available  during my care of the patient were reviewed by me and considered in my medical decision making (see chart for details).  Clinical Course as of Feb 25 1818  Wed Feb 25, 2017  1611 Initial BP in the 70s. Will start IV fluids, get labs. I think this is likely from gastroenteritis. Afebrile. No other signs/symptoms such as fevers or abd pain. Given she's in a facility, will get Cdiff.  [SG]    Clinical Course User Index [SG] Pricilla LovelessGoldston, Nishtha Raider, MD    Daughter now tells me that patient complained of blood in her stool. On my exam she has what appears to be a stage I pressure injury versus irritation from multiple stools. Has light brown stool on my exam and her fecal occult blood is negative. White blood cell count is likely  reactive from an acute viral GI illness. C. difficile test pending. At this point she has no hypotension but originally did have some low blood pressures. There is a question of whether or not the cuff was on appropriately but at this point with her having significant dehydration, a BUN of 45, and her age, I think admission with IV fluids is warranted. Dr. Izola Price to admit. I do not think there is a bacterial illness at this time.  Final Clinical Impressions(s) / ED Diagnoses   Final diagnoses:  Dehydration  Gastroenteritis    New Prescriptions New Prescriptions   No medications on file     Pricilla Loveless, MD 02/25/17 1820

## 2017-02-25 NOTE — H&P (Signed)
History and Physical    [name]:[id number] DOB: Aug 25, 1922 DOA: 02/25/2017  Referring MD/NP/PA: Dr. Lawrence Marseilles  PCP: Marden Noble, MD   Patient coming from: SNF  Chief Complaint: N/V/D  HPI: Gina Zavala is a 83 y.o. female, resident of SNF, presented for evaluation of one day duration of persistent nausea and three episodes of non bloody vomiting, mixed blood and non bloody diarrhea. Pt denies similar events in the past, no known sick contacts other than possible exposure to residents at the facility but she is not sure if she was around anyone who is sick. Pt denies any fevers, chills, reports poor oral intake. Pt reports occasional abd cramps but non at the time of the admission.  ED Course: Pt is clinically stable, VS notable for HR 79 bpm, RR in mid 20's on initial exam, SBP in 120's. Initial oxygen saturation 67% but repeat value 95% on RA. TRH asked to admit for further evaluation.   Review of Systems:  Constitutional: Negative for fever, chills, diaphoresis, activity change HENT: Negative for ear pain, nosebleeds, congestion, facial swelling, rhinorrhea, neck pain, neck stiffness and ear discharge.   Eyes: Negative for pain, discharge, redness, itching and visual disturbance.  Respiratory: Negative for cough, choking, chest tightness, shortness of breath Cardiovascular: Negative for chest pain, palpitations and leg swelling.  Gastrointestinal: Negative for abdominal distention.  Genitourinary: Negative for dysuria, urgency, frequency, hematuria, flank pain  Musculoskeletal: Negative for back pain, joint swelling, arthralgias and gait problem.  Neurological: Negative for dizziness, tremors, seizures, syncope, facial asymmetry  Hematological: Negative for adenopathy. Does not bruise/bleed easily.  Psychiatric/Behavioral: Negative for hallucinations, behavioral problems  Past Medical History:  Diagnosis Date  . Hypertension   . Hypo-osmolality and hyponatremia      Past Surgical History:  Procedure Laterality Date  . HUMERUS FRACTURE SURGERY Left    Social Hx:  reports that she quit smoking about 68 years ago. Her smoking use included Cigarettes. She has a 0.30 pack-year smoking history. She has never used smokeless tobacco. She reports that she does not drink alcohol or use drugs.  No Known Allergies  No family history of cancers.   Medication Sig  amLODipine 5 MG tablet Take 1 tablet (5 mg total) by mouth daily.  sulfamethoxazole-trimethoprim  Take 1 tablet by mouth 2 (two) times daily.    Physical Exam: Vitals:   02/25/17 1620 02/25/17 1624 02/25/17 1709  BP: 128/62 (!) 134/54 (!) 167/79  Pulse: 77  (!) 110  Resp: 19 16 20   Temp: 98.4 F (36.9 C)    TempSrc: Oral    SpO2: 95%  (!) 67%    Constitutional: NAD, calm, comfortable Vitals:   02/25/17 1620 02/25/17 1624 02/25/17 1709  BP: 128/62 (!) 134/54 (!) 167/79  Pulse: 77  (!) 110  Resp: 19 16 20   Temp: 98.4 F (36.9 C)    TempSrc: Oral    SpO2: 95%  (!) 67%   Eyes: PERRL, lids and conjunctivae normal ENMT: Mucous membranes are dry. Posterior pharynx clear of any exudate or lesions.Normal dentition.  Neck: normal, supple, no masses, no thyromegaly Respiratory: clear to auscultation bilaterally, no wheezing, no crackles.  Cardiovascular: Regular rate and rhythm, no murmurs / rubs / gallops.  Abdomen: slight tenderness in lower abd quadrants, no guarding  Musculoskeletal: no clubbing / cyanosis. No joint deformity upper and lower extremities.  Skin: no rashes, lesions, ulcers. No induration Neurologic: CN 2-12 grossly intact. Sensation intact, DTR normal. Strength 5/5 in all  4.  Psychiatric: Normal judgment and insight. Alert and oriented x 3. Normal mood.   Labs on Admission: I have personally reviewed following labs and imaging studies  CBC:  Recent Labs Lab 02/25/17 1610  WBC 16.3*  NEUTROABS 14.6*  HGB 12.8  HCT 39.2  MCV 88.3  PLT 424*   Basic Metabolic  Panel:  Recent Labs Lab 02/25/17 1610  NA 136  K 4.5  CL 96*  CO2 31  GLUCOSE 166*  BUN 45*  CREATININE 0.89  CALCIUM 9.9   Liver Function Tests:  Recent Labs Lab 02/25/17 1610  AST 22  ALT 14  ALKPHOS 79  BILITOT 0.6  PROT 7.0  ALBUMIN 3.9    Recent Labs Lab 02/25/17 1610  LIPASE 62*   Urine analysis:    Component Value Date/Time   COLORURINE YELLOW 08/02/2015 1302   APPEARANCEUR CLOUDY (A) 08/02/2015 1302   LABSPEC 1.015 08/02/2015 1302   PHURINE 7.5 08/02/2015 1302   GLUCOSEU NEGATIVE 08/02/2015 1302   HGBUR SMALL (A) 08/02/2015 1302   BILIRUBINUR NEGATIVE 08/02/2015 1302   KETONESUR NEGATIVE 08/02/2015 1302   PROTEINUR NEGATIVE 08/02/2015 1302   UROBILINOGEN 0.2 08/02/2015 1302   NITRITE NEGATIVE 08/02/2015 1302   LEUKOCYTESUR NEGATIVE 08/02/2015 1302   Radiological Exams on Admission: Dg Chest Portable 1 View  Result Date: 02/25/2017 CLINICAL DATA:  Hypotension EXAM: PORTABLE CHEST 1 VIEW COMPARISON:  08/02/2015 FINDINGS: Cardiac shadow is stable. Aortic calcifications are again seen. Bibasilar atelectasis is noted. No focal confluent infiltrate is seen. Scoliosis of the thoracic spine is noted. IMPRESSION: Bibasilar atelectasis.  No other focal abnormality is noted. Electronically Signed   By: Alcide CleverMark  Lukens M.D.   On: 02/25/2017 16:55   EKG: pending   Assessment/Plan Active Problems: Nausea and non bloody vomiting, mixed non bloody and bloody diarrhea, dehydration  - unclear etiology and suspect possible viral component - pt reports feeling better and would prefer to go home - her WBC is elevated and suspect again this to be viral - will admit for observation  - place on IVF - C. Diff and stool GI panel pending - hold off on ABX for now - if no improvement in next 12-24 hours, consider CT abd for further evaluation - keep on full liquid diet  - check CBC in AM  Thrombocytosis - reactive likely from acute illness - CBC in AM   HTN -  continue home medical regimen   DVT prophylaxis: SCD's Code Status: Full  Family Communication: Pt and daughter updated at bedside Disposition Plan: SNF in 1-2 days when N/V/D resolved  Consults called: None Admission status: Observation   Debbora PrestoIskra Magick-Jocabed Cheese MD [facility] Pager [phone]  If 7PM-7AM, please contact night-coverage [email or web address] Password [email or web address]  02/25/2017, 5:38 PM

## 2017-02-25 NOTE — ED Notes (Signed)
Bed: WA19 Expected date:  Expected time:  Means of arrival:  Comments: EMS N/V/D 

## 2017-02-26 DIAGNOSIS — E860 Dehydration: Secondary | ICD-10-CM | POA: Diagnosis not present

## 2017-02-26 DIAGNOSIS — K529 Noninfective gastroenteritis and colitis, unspecified: Secondary | ICD-10-CM | POA: Diagnosis not present

## 2017-02-26 LAB — URINALYSIS, ROUTINE W REFLEX MICROSCOPIC
Bilirubin Urine: NEGATIVE
GLUCOSE, UA: NEGATIVE mg/dL
Hgb urine dipstick: NEGATIVE
KETONES UR: NEGATIVE mg/dL
NITRITE: NEGATIVE
PH: 5 (ref 5.0–8.0)
Protein, ur: NEGATIVE mg/dL
SPECIFIC GRAVITY, URINE: 1.015 (ref 1.005–1.030)

## 2017-02-26 LAB — GASTROINTESTINAL PANEL BY PCR, STOOL (REPLACES STOOL CULTURE)

## 2017-02-26 LAB — BASIC METABOLIC PANEL
Anion gap: 6 (ref 5–15)
BUN: 30 mg/dL — AB (ref 6–20)
CO2: 27 mmol/L (ref 22–32)
CREATININE: 0.51 mg/dL (ref 0.44–1.00)
Calcium: 8.5 mg/dL — ABNORMAL LOW (ref 8.9–10.3)
Chloride: 103 mmol/L (ref 101–111)
GFR calc Af Amer: >60 mL/min (ref >60)
GFR calc non Af Amer: >60 mL/min (ref >60)
Glucose, Bld: 90 mg/dL (ref 65–99)
Potassium: 3.8 mmol/L (ref 3.5–5.1)
SODIUM: 136 mmol/L (ref 135–145)

## 2017-02-26 LAB — CBC
HCT: 32.8 % — ABNORMAL LOW (ref 36.0–46.0)
Hemoglobin: 10.7 g/dL — ABNORMAL LOW (ref 12.0–15.0)
MCH: 28.3 pg (ref 26.0–34.0)
MCHC: 32.6 g/dL (ref 30.0–36.0)
MCV: 86.8 fL (ref 78.0–100.0)
PLATELETS: 334 10*3/uL (ref 150–400)
RBC: 3.78 MIL/uL — ABNORMAL LOW (ref 3.87–5.11)
RDW: 15.2 % (ref 11.5–15.5)
WBC: 10.7 10*3/uL — ABNORMAL HIGH (ref 4.0–10.5)

## 2017-02-26 LAB — TSH: TSH: 1.926 u[IU]/mL (ref 0.350–4.500)

## 2017-02-26 NOTE — Discharge Instructions (Signed)
Diarrhea, Adult °Diarrhea is when you have loose and water poop (stool) often. Diarrhea can make you feel weak and cause you to get dehydrated. Dehydration can make you tired and thirsty, make you have a dry mouth, and make it so you pee (urinate) less often. Diarrhea often lasts 2-3 days. However, it can last longer if it is a sign of something more serious. It is important to treat your diarrhea as told by your doctor. °Follow these instructions at home: °Eating and drinking ° °Follow these recommendations as told by your doctor: °· Take an oral rehydration solution (ORS). This is a drink that is sold at pharmacies and stores. °· Drink clear fluids, such as: °? Water. °? Ice chips. °? Diluted fruit juice. °? Low-calorie sports drinks. °· Eat bland, easy-to-digest foods in small amounts as you are able. These foods include: °? Bananas. °? Applesauce. °? Rice. °? Low-fat (lean) meats. °? Toast. °? Crackers. °· Avoid drinking fluids that have a lot of sugar or caffeine in them. °· Avoid alcohol. °· Avoid spicy or fatty foods. ° °General instructions ° °· Drink enough fluid to keep your pee (urine) clear or pale yellow. °· Wash your hands often. If you cannot use soap and water, use hand sanitizer. °· Make sure that all people in your home wash their hands well and often. °· Take over-the-counter and prescription medicines only as told by your doctor. °· Rest at home while you get better. °· Watch your condition for any changes. °· Take a warm bath to help with any burning or pain from having diarrhea. °· Keep all follow-up visits as told by your doctor. This is important. °Contact a doctor if: °· You have a fever. °· Your diarrhea gets worse. °· You have new symptoms. °· You cannot keep fluids down. °· You feel light-headed or dizzy. °· You have a headache. °· You have muscle cramps. °Get help right away if: °· You have chest pain. °· You feel very weak or you pass out (faint). °· You have bloody or black poop or  poop that look like tar. °· You have very bad pain, cramping, or bloating in your belly (abdomen). °· You have trouble breathing or you are breathing very quickly. °· Your heart is beating very quickly. °· Your skin feels cold and clammy. °· You feel confused. °· You have signs of dehydration, such as: °? Dark pee, hardly any pee, or no pee. °? Cracked lips. °? Dry mouth. °? Sunken eyes. °? Sleepiness. °? Weakness. °This information is not intended to replace advice given to you by your health care provider. Make sure you discuss any questions you have with your health care provider. °Document Released: 02/25/2008 Document Revised: 03/28/2016 Document Reviewed: 05/15/2015 °Elsevier Interactive Patient Education © 2018 Elsevier Inc. ° °

## 2017-02-26 NOTE — Progress Notes (Signed)
Patient discharged to [facility] with daughter.

## 2017-02-26 NOTE — Discharge Summary (Signed)
Physician Discharge Summary  Gina Zavala ZOX:[id number] DOB: 07/16/1922 DOA: 02/25/2017  PCP: Marden NobleGates, Robert, MD  Admit date: 02/25/2017 Discharge date: 02/26/2017  Recommendations for Outpatient Follow-up:  1. Pt will need to follow up with PCP in 1 week post discharge 2. Please also check CBC to evaluate Hg and Hct levels  Discharge Diagnoses:  Active Problems:   Dehydration  Discharge Condition: Stable  Diet recommendation: Heart healthy diet discussed in details   History of present illness:  83 y.o. female, from SNF, presented with one day duration of nausea and non bloody vomiting and non bloody diarrhea.   Hospital Course:   Nausea and non bloody vomiting, mixed non bloody and bloody diarrhea, dehydration  - suspect viral etiology - pt reports feeling better, denies any further diarrhea and vomiting - wants to go home [place] - advanced diet and if pt tolerating well can be discharged home  - GI stool panel pending   Thrombocytosis - reactive likely from acute illness - resolved    HTN - resume home medical regimen    Procedures/Studies: Dg Chest Portable 1 View  Result Date: 02/25/2017 CLINICAL DATA:  Hypotension EXAM: PORTABLE CHEST 1 VIEW COMPARISON:  08/02/2015 FINDINGS: Cardiac shadow is stable. Aortic calcifications are again seen. Bibasilar atelectasis is noted. No focal confluent infiltrate is seen. Scoliosis of the thoracic spine is noted. IMPRESSION: Bibasilar atelectasis.  No other focal abnormality is noted. Electronically Signed   By: Gina Zavala M.D.   On: 02/25/2017 16:55     Discharge Exam: Vitals:   02/25/17 2020 02/26/17 0555  BP: (!) 140/57 133/60  Pulse: 87 74  Resp: 20 18  Temp: 97.5 F (36.4 C) 97.4 F (36.3 C)   Vitals:   02/25/17 1830 02/25/17 2020 02/26/17 0555 02/26/17 0622  BP: (!) 122/46 (!) 140/57 133/60   Pulse: 79 87 74   Resp: (!) 34 20 18   Temp:  97.5 F (36.4 C) 97.4 F (36.3 C)   TempSrc:  Oral Axillary   SpO2:  95% 96% 95%   Weight:  32.7 kg (72 lb 3.2 oz)  32.3 kg (71 lb 3.2 oz)  Height:  4\' 11"  (1.499 m)      General: Pt is alert, follows commands appropriately, not in acute distress Cardiovascular: Regular rate and rhythm, S1/S2 +, no rubs, no gallops Respiratory: Clear to auscultation bilaterally, no wheezing, no crackles, no rhonchi Abdominal: Soft, non tender, non distended, bowel sounds +, no guarding Extremities: no edema, no cyanosis, pulses palpable bilaterally DP and PT Neuro: Grossly nonfocal  Discharge Instructions  Discharge Instructions    Diet - low sodium heart healthy    Complete by:  As directed    Increase activity slowly    Complete by:  As directed      Allergies as of 02/26/2017   No Known Allergies     Medication List    TAKE these medications   acetaminophen 500 MG tablet Commonly known as:  TYLENOL Take 250 mg by mouth every 4 (four) hours as needed for moderate pain. Reported on 11/13/2015   amLODipine 5 MG tablet Commonly known as:  NORVASC Take 1 tablet (5 mg total) by mouth daily.   Biotin 8119110000 MCG Tabs Take 1 tablet by mouth every 3 (three) days.   CENTRUM SILVER 50+WOMEN Tabs Take 1 tablet by mouth as directed. MWF   Lutein 40 MG Caps Take 1 capsule by mouth 3 (three) times a week. MWF  Lutein 20 MG Caps Take 1 capsule by mouth as directed. Evern Bio, Thurs, Sat      Follow-up Information    Marden Noble, MD Follow up.   Specialty:  Internal Medicine Contact information: [address] [place] [address] [phone]            The results of significant diagnostics from this hospitalization (including imaging, microbiology, ancillary and laboratory) are listed below for reference.     Microbiology: Recent Results (from the past 240 hour(s))  MRSA PCR Screening     Status: None   Collection Time: 02/25/17  9:13 PM  Result Value Ref Range Status   MRSA by PCR NEGATIVE NEGATIVE Final    Comment:         The GeneXpert MRSA Assay (FDA approved for NASAL specimens only), is one component of a comprehensive MRSA colonization surveillance program. It is not intended to diagnose MRSA infection nor to guide or monitor treatment for MRSA infections.      Labs: Basic Metabolic Panel:  Recent Labs Lab 02/25/17 1610 02/26/17 0418  NA 136 136  K 4.5 3.8  CL 96* 103  CO2 31 27  GLUCOSE 166* 90  BUN 45* 30*  CREATININE 0.89 0.51  CALCIUM 9.9 8.5*   Liver Function Tests:  Recent Labs Lab 02/25/17 1610  AST 22  ALT 14  ALKPHOS 79  BILITOT 0.6  PROT 7.0  ALBUMIN 3.9    Recent Labs Lab 02/25/17 1610  LIPASE 62*   No results for input(s): AMMONIA in the last 168 hours. CBC:  Recent Labs Lab 02/25/17 1610 02/26/17 0418  WBC 16.3* 10.7*  NEUTROABS 14.6*  --   HGB 12.8 10.7*  HCT 39.2 32.8*  MCV 88.3 86.8  PLT 424* 334   SIGNED: Time coordinating discharge:  30 minutes  Debbora Presto, MD  [facility] 02/26/2017, 1:05 PM Pager [phone]  If 7PM-7AM, please contact night-coverage [email or web address] Password [email or web address]

## 2017-02-26 NOTE — Clinical Social Work Note (Signed)
Clinical Social Work Assessment  Patient Details  Name: Gina Zavala MRN: [id number] Date of Birth: [birth year]  Date of referral:  02/26/17               Reason for consult:  Discharge Planning (pt admitted from ALF)                Permission sought to share information with:  Family Supports Permission granted to share information::  Yes, Verbal Permission Granted  Name::     daughter Claudette Head  Agency::  [name]  Relationship::     Contact Information:     Housing/Transportation Living arrangements for the past 2 months:  [place] of Information:  Facility, Adult Children, Medical Team Patient Interpreter Needed:  None Criminal Activity/Legal Involvement Pertinent to Current Situation/Hospitalization:  No - Comment as needed Significant Relationships:  Adult Children, Warehouse manager Lives with:  Facility Resident Do you feel safe going back to the place where you live?  Yes Need for family participation in patient care:  No (Coment)  Care giving concerns:  Pt from [facility], has resided there for several years. No caregiving concerns reported by daughter. Per facility representative Seth Bake, pt largely independent at facility with ADLs and limited assistance ambulating at times, generally independent with walker. Visited pt in hospital and feel pt's current needs can be met at ALF at DC. Daughter reports she and husband will transport pt back to ALF when ready for DC.    Social Worker assessment / plan:  CSW consulted as pt is from facility- [facility]. Spoke with daughter and facility representative, both confirm plan to return at DC. Will complete FL2 and provide to facility via the [place]. Daughter and son-in-law will provide transportation.  Plan: return to [place].   Employment status:  Retired Forensic scientist:  Medicare PT Recommendations:  Not assessed at this time Information / Referral to community  resources:     Patient/Family's Response to care:  Engaged and appreciative of care  Patient/Family's Understanding of and Emotional Response to Diagnosis, Current Treatment, and Prognosis:  Pt and daughter  demonstrated thorough understanding of plan. Seem happy to be discharging from hospital and "feeling like herself."  Emotional Assessment Appearance:  Appears stated age Attitude/Demeanor/Rapport:   (pleasant) Affect (typically observed):  Accepting Orientation:  Oriented to Self, Oriented to Place, Oriented to  Time, Oriented to Situation Alcohol / Substance use:  Not Applicable Psych involvement (Current and /or in the community):  No (Comment)  Discharge Needs  Concerns to be addressed:  No discharge needs identified Readmission within the last 30 days:  No Current discharge risk:  None Barriers to Discharge:  No Barriers Identified   Nila Nephew, LCSW 02/26/2017, 11:04 AM  [phone]

## 2017-02-26 NOTE — Progress Notes (Signed)
LCSW following for discharge planning/disposition:  Pt returning to [facility] at [place] today.  Patient will transport by daughter and son-in-law Family notified regarding discharge - daughter Olivia Mackieressa All information sent to facility by HUB Report number for RN:  - RN Sue LushAndrea at ALF reports she received DC summary and FL2 and there is no need to call report.  No other needs at this time   Plan: [facility] to [facility].  Ilean SkillMeghan Kaila Devries, MSW, LCSW Clinical Social Work 02/26/2017 [phone]

## 2017-02-26 NOTE — NC FL2 (Signed)
[place] MEDICAID FL2 LEVEL OF CARE SCREENING TOOL     IDENTIFICATION  Patient Name: Gina Zavala Birthdate: March 21, 1922 Sex: female Admission Date (Current Location): 02/25/2017  [facility] and [place] Number:  Producer, television/film/video and Address:  [facility],  [address] [place], [place] [address]      Provider Number: [id number]  Attending Physician Name and Address:  Dorothea Ogle, MD  Relative Name and Phone Number:       Current Level of Care: Hospital Recommended Level of Care: Assisted Living Facility Prior Approval Number:    Date Approved/Denied:   PASRR Number:    Discharge Plan:  (Assisted Living)    Current Diagnoses: Patient Active Problem List   Diagnosis Date Noted  . Dehydration 02/25/2017  . At risk for falling 09/01/2014  . Carotid bruit 01/14/2012  . Osteoporosis 01/14/2012  . HTN (hypertension) 09/01/2011  . Scoliosis 09/01/2011  . Hyponatremia 09/01/2011  . Uterine prolapse 09/01/2011    Orientation RESPIRATION BLADDER Height & Weight     Self, Situation, Place  Normal Incontinent Weight: 71 lb 3.2 oz (32.3 kg) Height:  4\' 11"  (149.9 cm)  BEHAVIORAL SYMPTOMS/MOOD NEUROLOGICAL BOWEL NUTRITION STATUS      Continent Diet (regular)  AMBULATORY STATUS COMMUNICATION OF NEEDS Skin   Supervision Verbally Normal                       Personal Care Assistance Level of Assistance  Bathing, Feeding, Dressing Bathing Assistance: Limited assistance Feeding assistance: Independent Dressing Assistance: Limited assistance     Functional Limitations Info  Sight, Hearing, Speech Sight Info: Adequate Hearing Info: Adequate Speech Info: Adequate    SPECIAL CARE FACTORS FREQUENCY                       Contractures Contractures Info: Not present    Additional Factors Info  Code Status, Allergies Code Status Info: full Allergies Info: NKA           Current Medications (02/26/2017):  This is the current hospital active  medication list Current Facility-Administered Medications  Medication Dose Route Frequency Provider Last Rate Last Dose  . 0.9 %  sodium chloride infusion   Intravenous Continuous Dorothea Ogle, MD 75 mL/hr at 02/25/17 2052 1,000 mL at 02/25/17 2052  . acetaminophen (TYLENOL) tablet 500 mg  500 mg Oral Q4H PRN Dorothea Ogle, MD      . amLODipine (NORVASC) tablet 5 mg  5 mg Oral Daily Dorothea Ogle, MD   5 mg at 02/26/17 8119  . feeding supplement (ENSURE ENLIVE) (ENSURE ENLIVE) liquid 237 mL  237 mL Oral BID BM Dorothea Ogle, MD   237 mL at 02/26/17 1000  . ondansetron (ZOFRAN) tablet 4 mg  4 mg Oral Q6H PRN Dorothea Ogle, MD       Or  . ondansetron [facility]) injection 4 mg  4 mg Intravenous Q6H PRN Dorothea Ogle, MD         Discharge Medications: TAKE these medications   acetaminophen 500 MG tablet Commonly known as:  TYLENOL Take 250 mg by mouth every 4 (four) hours as needed for moderate pain. Reported on 11/13/2015   amLODipine 5 MG tablet Commonly known as:  NORVASC Take 1 tablet (5 mg total) by mouth daily.   Biotin 14782 MCG Tabs Take 1 tablet by mouth every 3 (three) days.   CENTRUM SILVER 50+WOMEN Tabs Take 1 tablet  by mouth as directed. MWF   Lutein 40 MG Caps Take 1 capsule by mouth 3 (three) times a week. MWF   Lutein 20 MG Caps Take 1 capsule by mouth as directed. Evern BioSun, Tues, Thurs, Sat    Relevant Imaging Results:  Relevant Lab Results:   Additional Information SS#[id number]  Nelwyn SalisburyMeghan R Marili Vader, LCSW

## 2017-02-26 NOTE — Progress Notes (Signed)
Nursing Note: Received report from day shift nurse that pt will coming shortly form the ED.Pt arrived,awake,alert,anxious,likes door open and clearly HOH.Must talk loud so that pt can hear.MAE well.T-97.5 P-87 R-20 BP-140/57 PO2 96% on r/a.Pt oriented to room,and call bell in reach.Bed alarm on for safety.wbb

## 2017-03-09 DIAGNOSIS — K529 Noninfective gastroenteritis and colitis, unspecified: Secondary | ICD-10-CM | POA: Diagnosis not present

## 2017-03-29 IMAGING — CR DG RIBS W/ CHEST 3+V*R*
3 series · 3 of 3 positions shown · non-contrast
Comparison: Two-view chest x-ray 12/01/2012.

CLINICAL DATA: Fall today.  Right-sided rib pain.

EXAM:
RIGHT RIBS AND CHEST - 3+ VIEW

[x chest ap (1 of 3)]
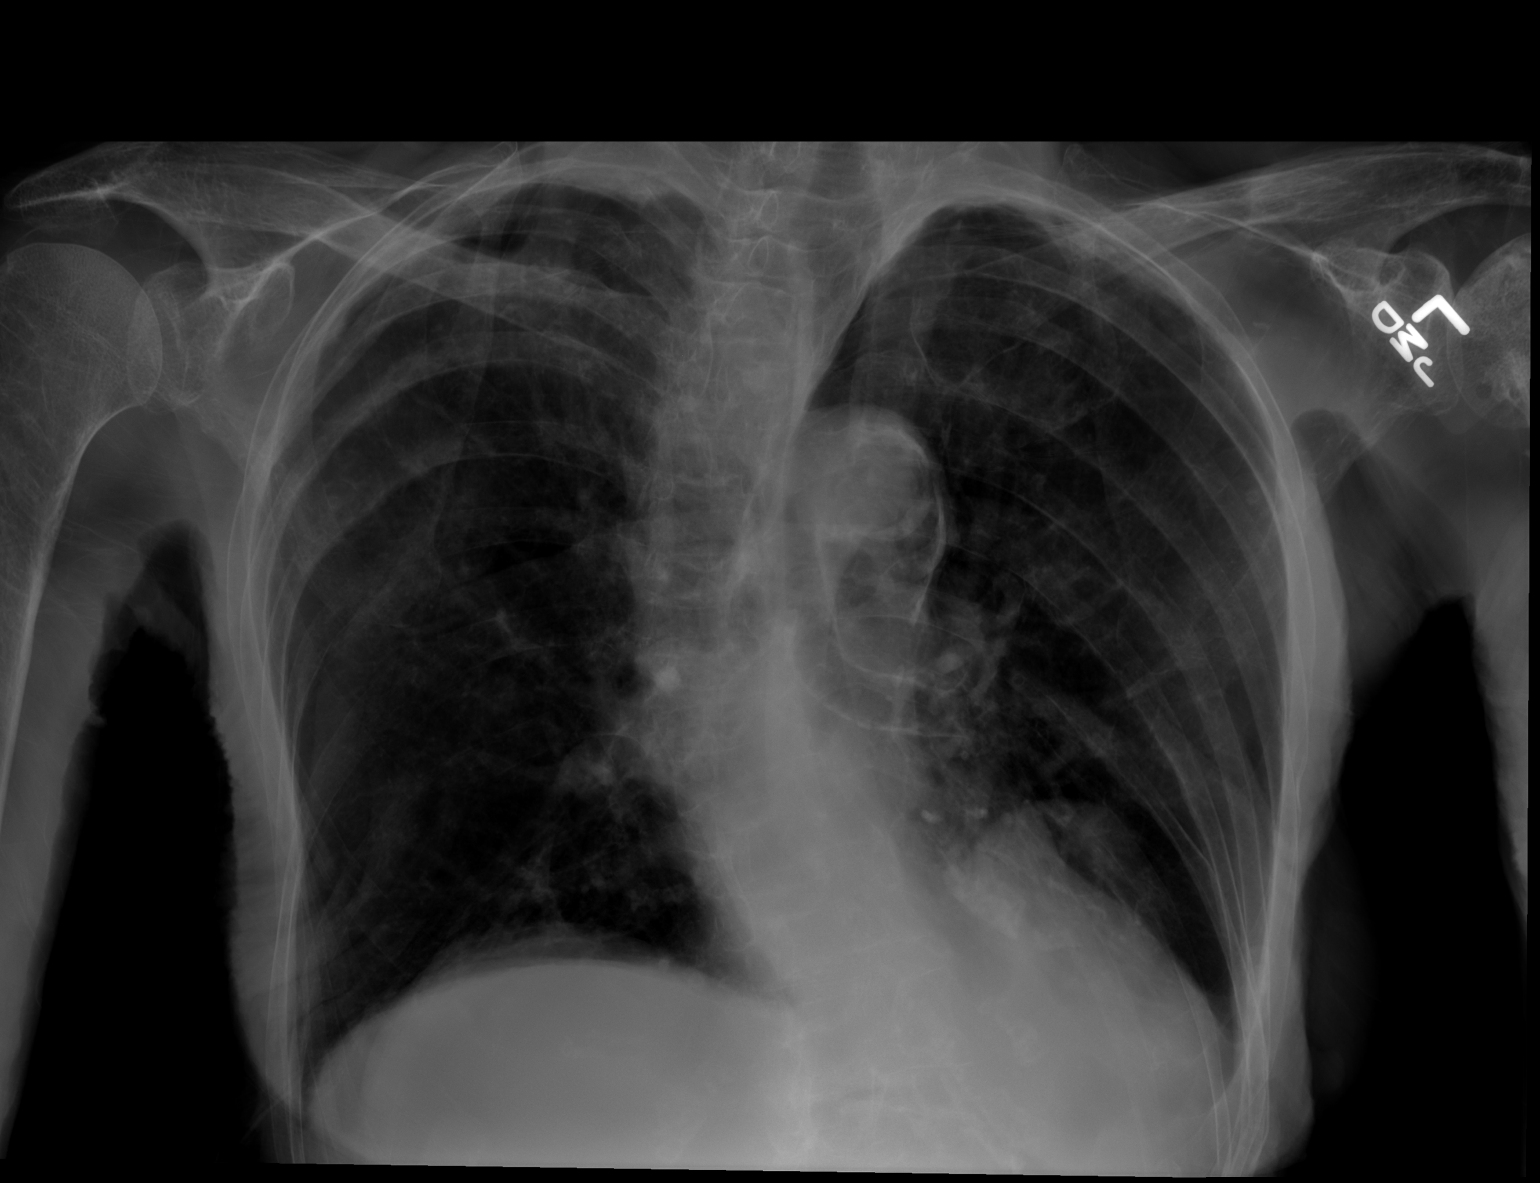

[x chest ap (2 of 3)]
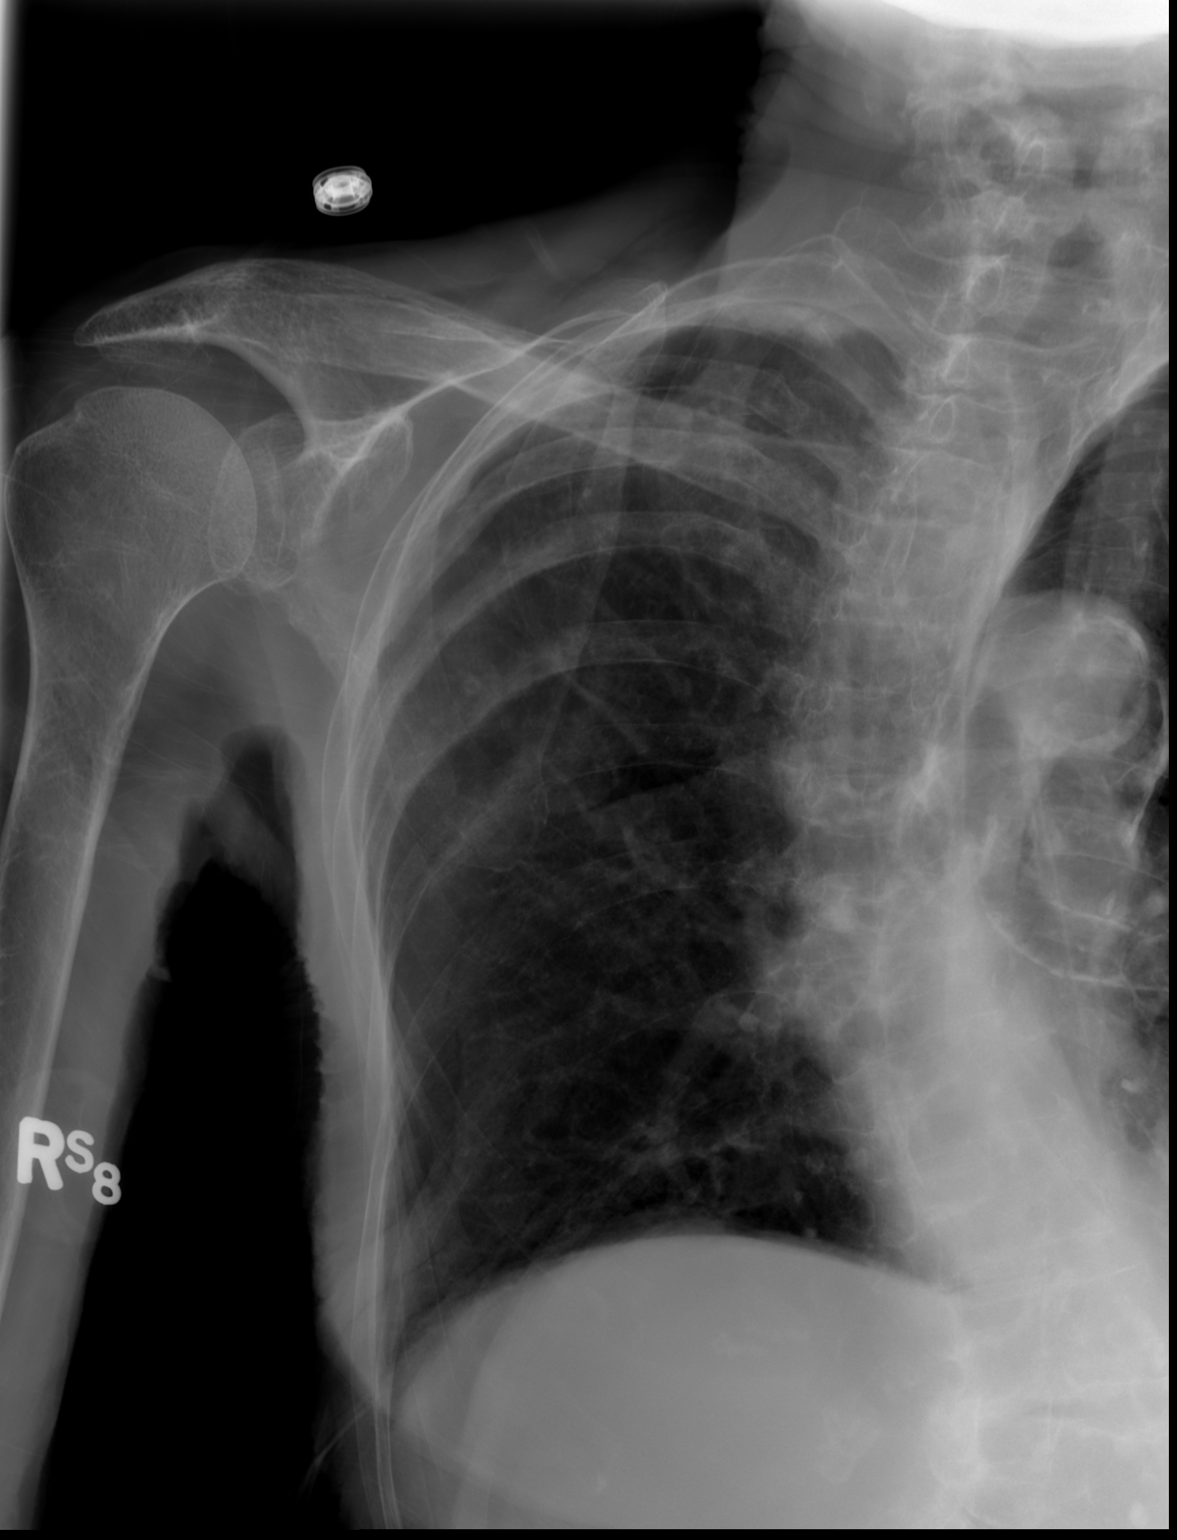

[x chest ap (3 of 3)]
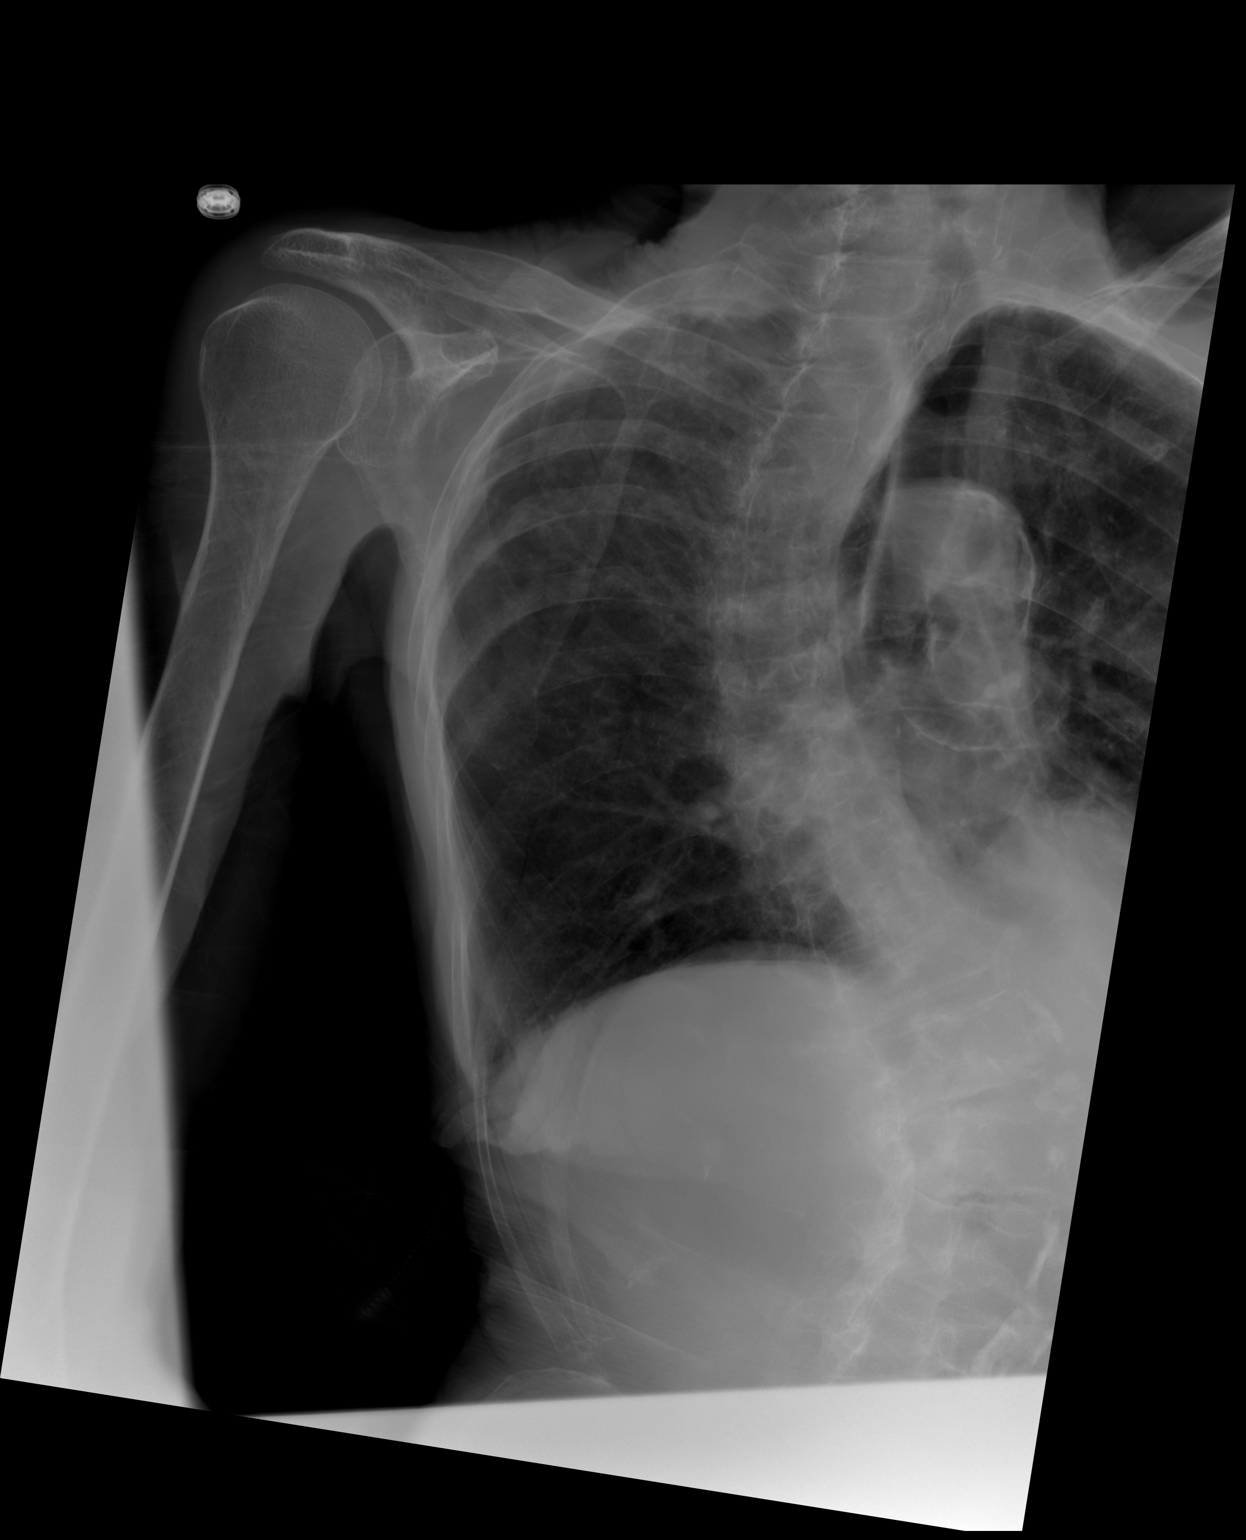

[3 of 3 positions shown; findings below may reference images not displayed]

FINDINGS: Heart size is normal. Atherosclerotic calcifications are present in
the aorta. Scoliosis is present. Emphysema is again noted.

Dedicated imaging of the ribs demonstrate no acute or healing
fractures.
IMPRESSION: 1. No acute abnormality or significant interval change.
2. No evidence for acute or healing fractures.
3. Stable emphysema and scoliosis.

## 2017-03-29 IMAGING — CT CT HEAD W/O CM
4 of 6 series · 14 of 47 positions shown, 15 images · non-contrast
Comparison: None.

CLINICAL DATA: Fall at [HOSPITAL] with forehead laceration on the
right. Initial encounter.

EXAM:
CT HEAD WITHOUT CONTRAST
CT CERVICAL SPINE WITHOUT CONTRAST
TECHNIQUE: Multidetector CT imaging of the head and cervical spine was
performed following the standard protocol without intravenous
contrast. Multiplanar CT image reconstructions of the cervical spine
were also generated.

[Series 3: head w/o · axial · non-contrast · 0.43mm/px · z∈[-162,-92]mm · 3 of 29 slices shown, 4 images]
[im 8/29  brain]
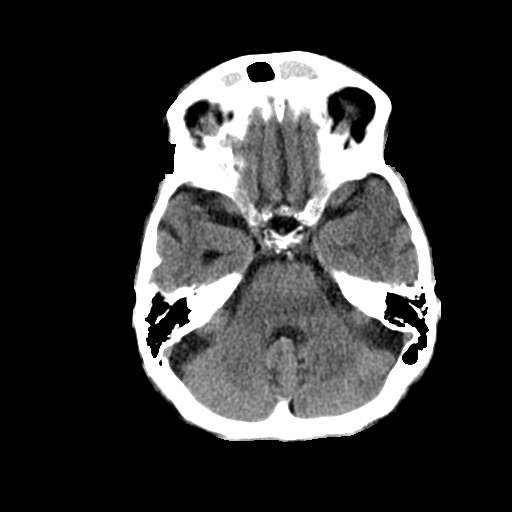
[im 8/29  bone]
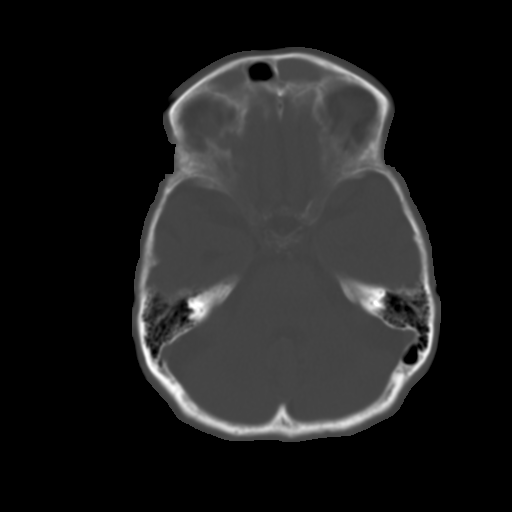
[im 15/29  brain]
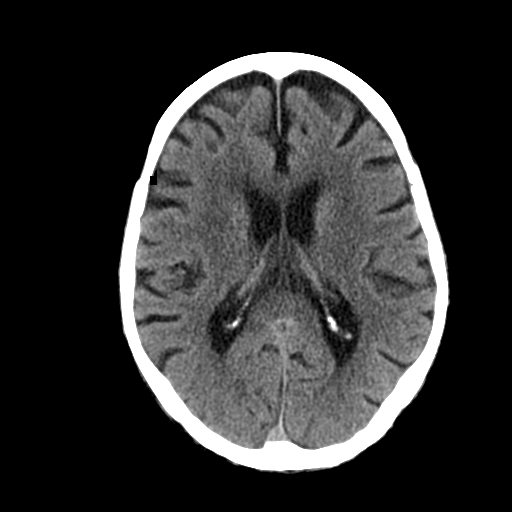
[im 22/29  brain]
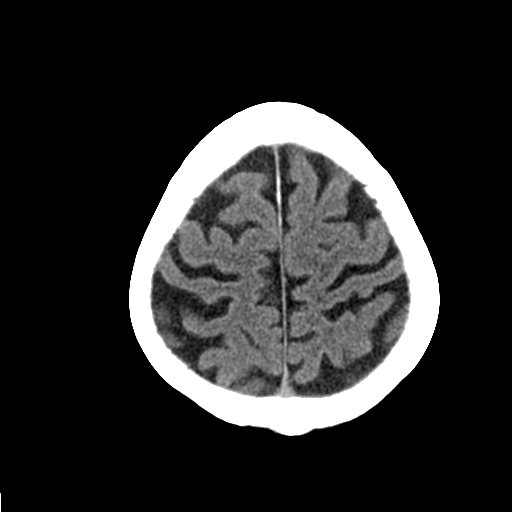

[Series 7: axial recon · axial · 0.18mm/px · z∈[-316,-250]mm · 5 of 78 slices shown]
[im 8/78  brain]
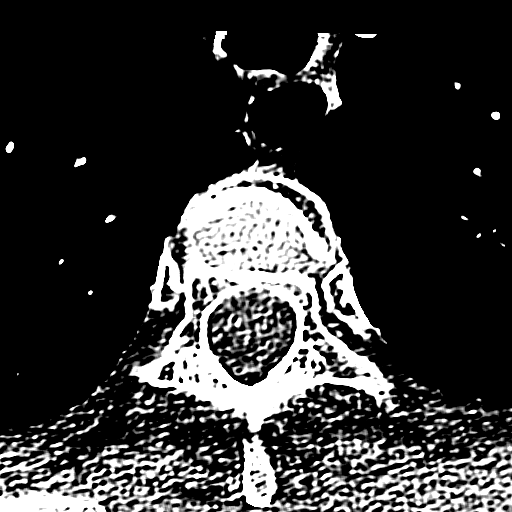
[im 16/78  brain]
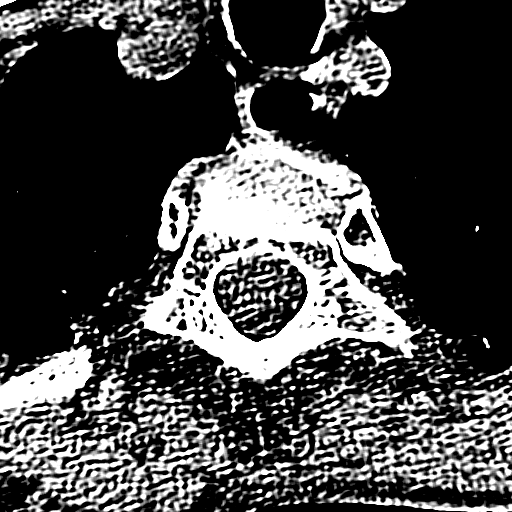
[im 24/78  brain]
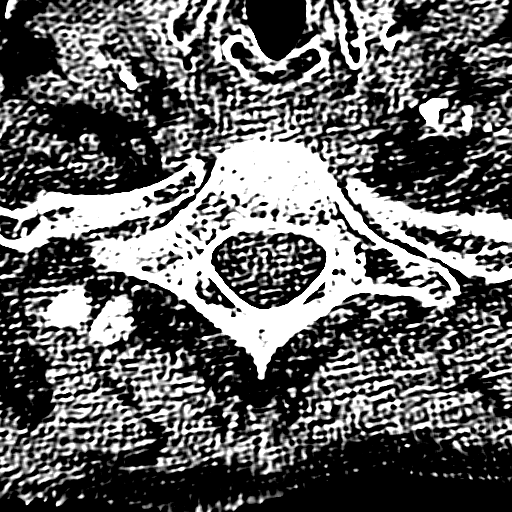
[im 31/78  brain]
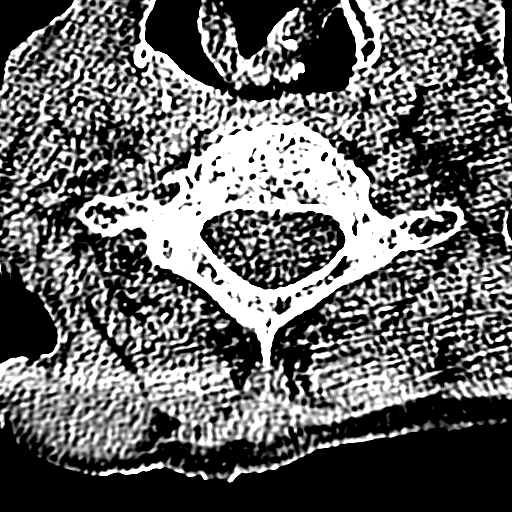
[im 47/78  brain]
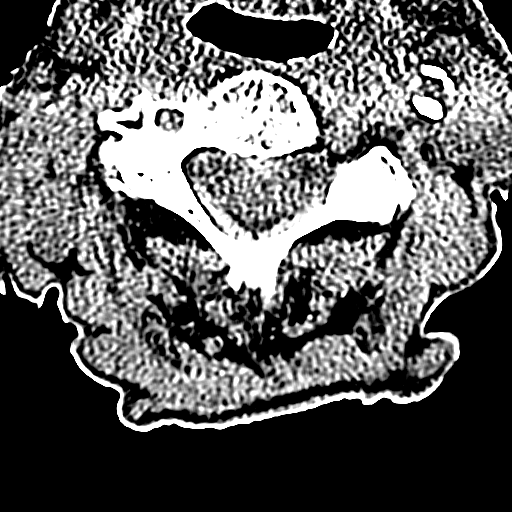

[Series 8: coronal · coronal · 0.23mm/px · 3 of 43 slices shown]
[im 15/43  brain]
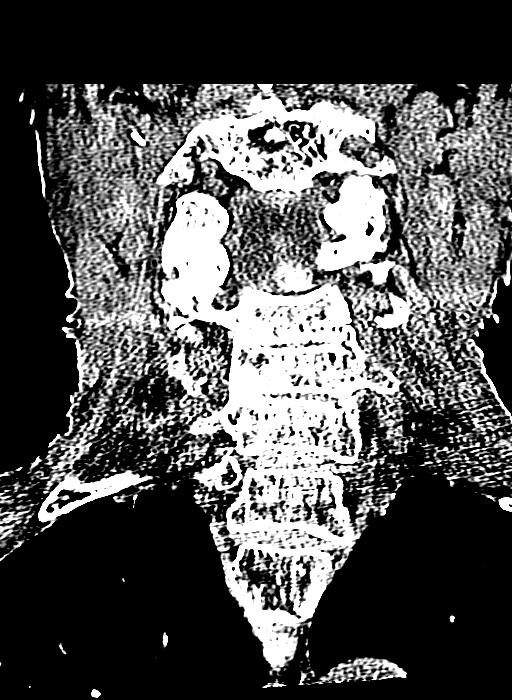
[im 19/43  brain]
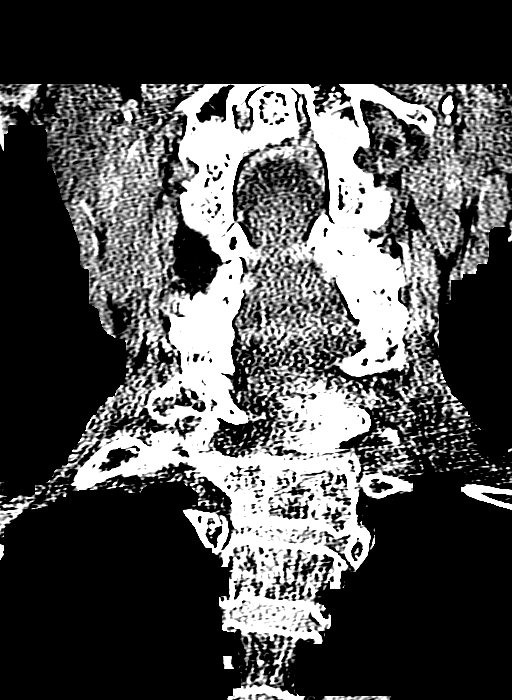
[im 24/43  brain]
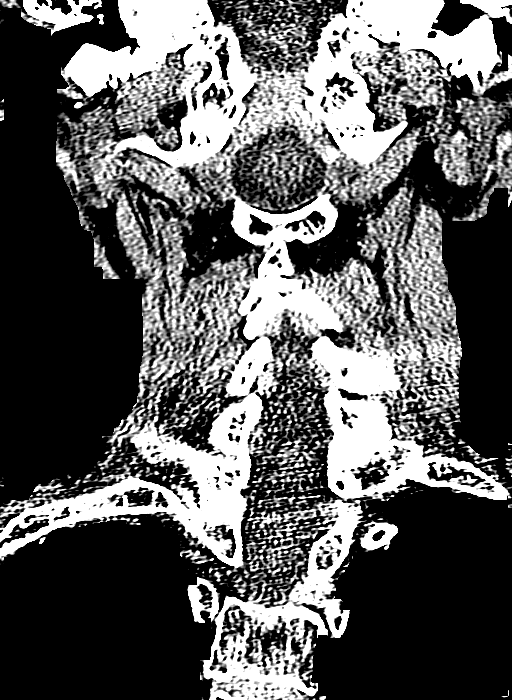

[Series 9: sagittal · sagittal · 0.17mm/px · 3 of 41 slices shown]
[im 14/41  brain]
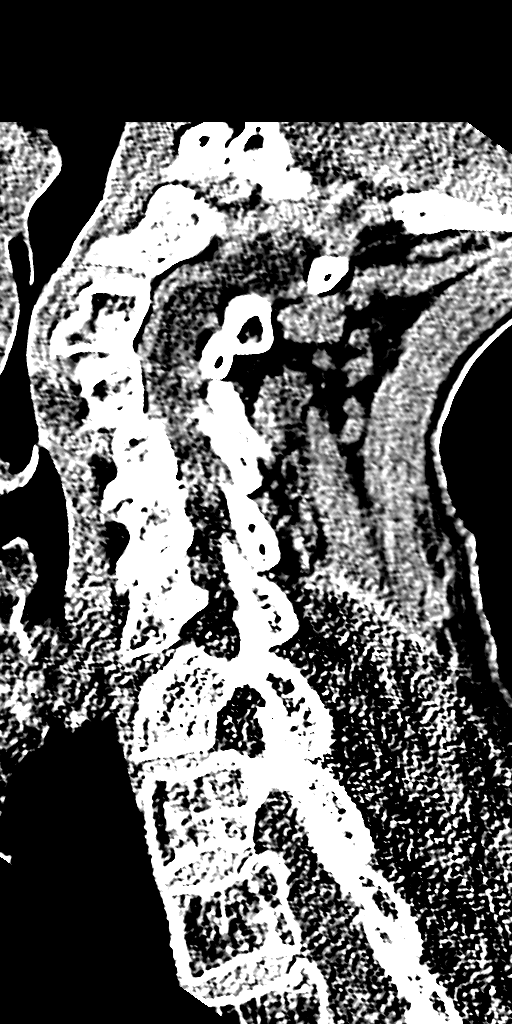
[im 21/41  brain]
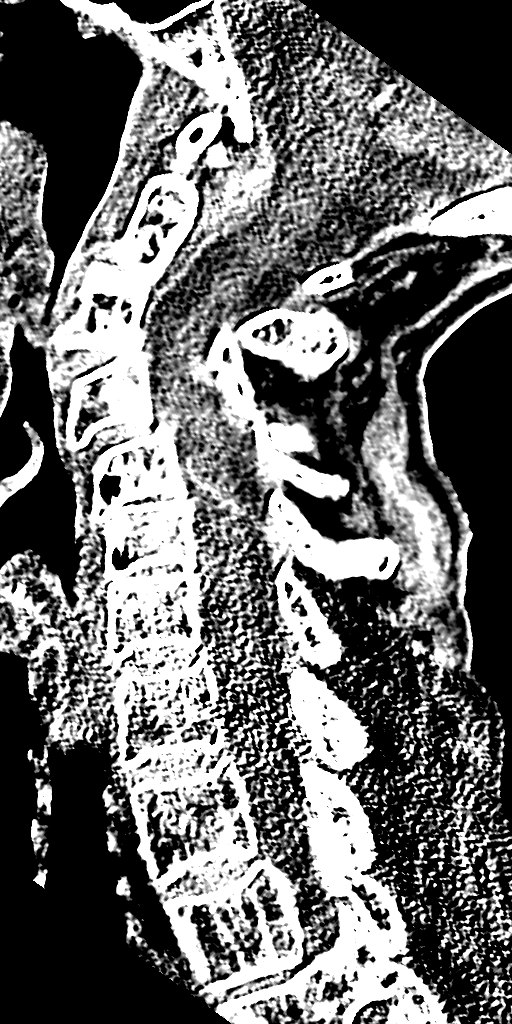
[im 27/41  brain]
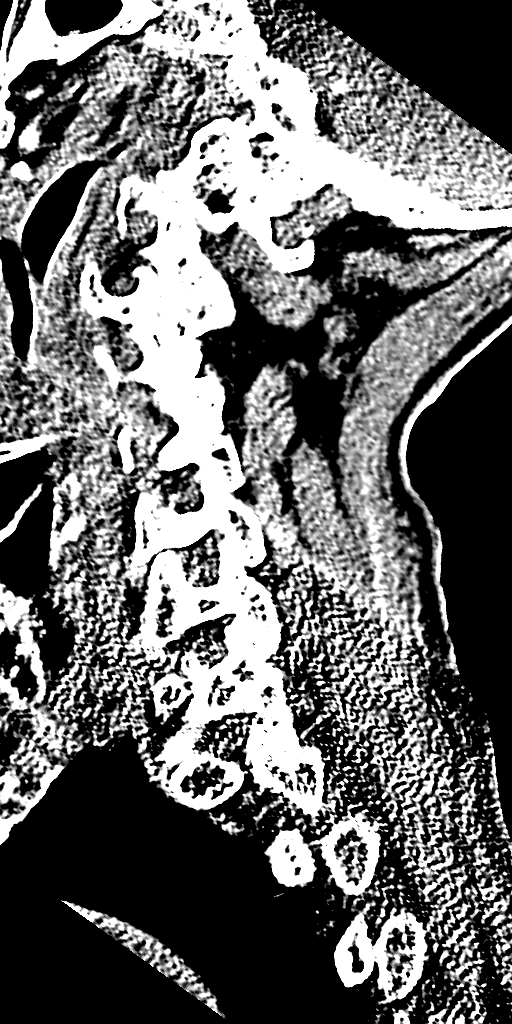

[14 of 47 positions shown; findings below may reference images not displayed]

FINDINGS: CT HEAD FINDINGS

Skull and Sinuses:Deep laceration to the right forehead without
opaque foreign body or underlying fracture.

There is extensive opacification of bilateral ethmoid and frontal
sinuses with fluid levels within the frontal sinuses. No sinus
expansion or bony destruction.

Orbits: Bilateral cataract resection.  No traumatic finding.

Brain: No evidence of acute infarction, hemorrhage, hydrocephalus,
or mass lesion/mass effect.

Chronic small vessel ischemic change with mild gliosis around the
lateral ventricles. Remote lacunar infarct in the upper right
cerebellum. Cerebral volume within normal limits for age.

CT CERVICAL SPINE FINDINGS

No evidence of acute fracture. No gross cervical canal hematoma or
prevertebral edema. There is 3 mm of C4-5 anterolisthesis associated
with advanced facet arthropathy, the most likely cause. C3-4
anterolisthesis is milder but also appears facet mediated.
Degenerative disc disease with narrowing greatest at C5-6 and C6-7.
IMPRESSION: 1. Negative for acute intracranial finding or cervical spine
fracture.
2. Right supraorbital laceration without fracture or foreign body.
3. C3-4 and C4-5 anterolisthesis which appears degenerative.
4. Advanced ethmoid and frontal sinusitis with fluid levels.

## 2017-04-22 ENCOUNTER — Ambulatory Visit: Payer: Medicare Other | Admitting: Podiatry

## 2017-05-06 DIAGNOSIS — N8111 Cystocele, midline: Secondary | ICD-10-CM | POA: Diagnosis not present

## 2017-06-01 DIAGNOSIS — R3915 Urgency of urination: Secondary | ICD-10-CM | POA: Diagnosis not present

## 2017-06-01 DIAGNOSIS — R350 Frequency of micturition: Secondary | ICD-10-CM | POA: Diagnosis not present

## 2017-06-02 ENCOUNTER — Ambulatory Visit: Payer: Medicare Other | Admitting: Podiatry

## 2017-06-22 ENCOUNTER — Ambulatory Visit: Payer: Medicare Other | Admitting: Podiatry

## 2017-07-14 ENCOUNTER — Ambulatory Visit (INDEPENDENT_AMBULATORY_CARE_PROVIDER_SITE_OTHER): Payer: Medicare Other | Admitting: Podiatry

## 2017-07-14 ENCOUNTER — Encounter: Payer: Self-pay | Admitting: Podiatry

## 2017-07-14 DIAGNOSIS — B351 Tinea unguium: Secondary | ICD-10-CM

## 2017-07-14 DIAGNOSIS — M79609 Pain in unspecified limb: Principal | ICD-10-CM

## 2017-07-14 DIAGNOSIS — M79676 Pain in unspecified toe(s): Secondary | ICD-10-CM | POA: Diagnosis not present

## 2017-07-14 NOTE — Progress Notes (Signed)
Patient ID: Gina ChangMary E Zavala, female   DOB: 07/18/1922, 83 y.o.   MRN: [id number]   Subjective: This patient presents again complaining of toenails that are uncomfortable walking wearing shoes and request toenail debridement. Patient states she is unable to trim toenails herself. She had a previous visit on 12/22/2016 for this complaint  Objective: Orientated 3 Difficulty with hearing DP and PT pulses 1/4 bilaterally Capillary reflex within normal bilaterally Neurological deferred No open skin lesions bilaterally Atrophic skin with absent hair growth bilaterally The toenails are elongated, brittle, deformed and tender direct palpation 6-10 There is no restriction ankle, subtalar, midtarsal joints bilaterally  Assessment: Decrease pedal pulses bilaterally Symptomatic onychomycoses 6-10  Plan: Debridement of toenails 6-10 mechanically an electrical without any bleeding  Reappoint 3 months

## 2017-07-15 DIAGNOSIS — N8111 Cystocele, midline: Secondary | ICD-10-CM | POA: Diagnosis not present

## 2017-07-15 DIAGNOSIS — Z23 Encounter for immunization: Secondary | ICD-10-CM | POA: Diagnosis not present

## 2017-07-21 DIAGNOSIS — M4120 Other idiopathic scoliosis, site unspecified: Secondary | ICD-10-CM | POA: Diagnosis not present

## 2017-07-21 DIAGNOSIS — N814 Uterovaginal prolapse, unspecified: Secondary | ICD-10-CM | POA: Diagnosis not present

## 2017-07-21 DIAGNOSIS — R269 Unspecified abnormalities of gait and mobility: Secondary | ICD-10-CM | POA: Diagnosis not present

## 2017-07-21 DIAGNOSIS — R32 Unspecified urinary incontinence: Secondary | ICD-10-CM | POA: Diagnosis not present

## 2017-07-21 DIAGNOSIS — F411 Generalized anxiety disorder: Secondary | ICD-10-CM | POA: Diagnosis not present

## 2017-07-21 DIAGNOSIS — I10 Essential (primary) hypertension: Secondary | ICD-10-CM | POA: Diagnosis not present

## 2017-07-21 DIAGNOSIS — R54 Age-related physical debility: Secondary | ICD-10-CM | POA: Diagnosis not present

## 2017-07-21 DIAGNOSIS — M79671 Pain in right foot: Secondary | ICD-10-CM | POA: Diagnosis not present

## 2017-07-21 DIAGNOSIS — Z79899 Other long term (current) drug therapy: Secondary | ICD-10-CM | POA: Diagnosis not present

## 2017-07-21 DIAGNOSIS — E538 Deficiency of other specified B group vitamins: Secondary | ICD-10-CM | POA: Diagnosis not present

## 2017-07-21 DIAGNOSIS — Z4689 Encounter for fitting and adjustment of other specified devices: Secondary | ICD-10-CM | POA: Diagnosis not present

## 2017-10-13 ENCOUNTER — Ambulatory Visit: Payer: Medicare Other | Admitting: Podiatry

## 2017-10-19 ENCOUNTER — Ambulatory Visit: Payer: Medicare Other | Admitting: Podiatry

## 2017-11-03 DIAGNOSIS — Z4689 Encounter for fitting and adjustment of other specified devices: Secondary | ICD-10-CM | POA: Diagnosis not present

## 2017-11-03 DIAGNOSIS — N814 Uterovaginal prolapse, unspecified: Secondary | ICD-10-CM | POA: Diagnosis not present

## 2017-11-03 DIAGNOSIS — I10 Essential (primary) hypertension: Secondary | ICD-10-CM | POA: Diagnosis not present

## 2017-11-03 DIAGNOSIS — R54 Age-related physical debility: Secondary | ICD-10-CM | POA: Diagnosis not present

## 2017-11-03 DIAGNOSIS — R269 Unspecified abnormalities of gait and mobility: Secondary | ICD-10-CM | POA: Diagnosis not present

## 2017-11-03 DIAGNOSIS — R634 Abnormal weight loss: Secondary | ICD-10-CM | POA: Diagnosis not present

## 2017-11-03 DIAGNOSIS — F411 Generalized anxiety disorder: Secondary | ICD-10-CM | POA: Diagnosis not present

## 2017-11-03 DIAGNOSIS — R32 Unspecified urinary incontinence: Secondary | ICD-10-CM | POA: Diagnosis not present

## 2017-11-03 DIAGNOSIS — M4120 Other idiopathic scoliosis, site unspecified: Secondary | ICD-10-CM | POA: Diagnosis not present

## 2017-11-03 DIAGNOSIS — E538 Deficiency of other specified B group vitamins: Secondary | ICD-10-CM | POA: Diagnosis not present

## 2017-11-03 DIAGNOSIS — Z79899 Other long term (current) drug therapy: Secondary | ICD-10-CM | POA: Diagnosis not present

## 2017-11-04 DIAGNOSIS — R350 Frequency of micturition: Secondary | ICD-10-CM | POA: Diagnosis not present

## 2017-11-04 DIAGNOSIS — R32 Unspecified urinary incontinence: Secondary | ICD-10-CM | POA: Diagnosis not present

## 2017-11-04 DIAGNOSIS — N8111 Cystocele, midline: Secondary | ICD-10-CM | POA: Diagnosis not present

## 2017-11-10 DIAGNOSIS — R634 Abnormal weight loss: Secondary | ICD-10-CM | POA: Diagnosis not present

## 2017-11-10 DIAGNOSIS — M4120 Other idiopathic scoliosis, site unspecified: Secondary | ICD-10-CM | POA: Diagnosis not present

## 2017-11-10 DIAGNOSIS — I10 Essential (primary) hypertension: Secondary | ICD-10-CM | POA: Diagnosis not present

## 2017-11-10 DIAGNOSIS — R54 Age-related physical debility: Secondary | ICD-10-CM | POA: Diagnosis not present

## 2017-11-10 DIAGNOSIS — F411 Generalized anxiety disorder: Secondary | ICD-10-CM | POA: Diagnosis not present

## 2017-11-10 DIAGNOSIS — Z9181 History of falling: Secondary | ICD-10-CM | POA: Diagnosis not present

## 2017-11-10 DIAGNOSIS — R269 Unspecified abnormalities of gait and mobility: Secondary | ICD-10-CM | POA: Diagnosis not present

## 2017-11-12 DIAGNOSIS — R54 Age-related physical debility: Secondary | ICD-10-CM | POA: Diagnosis not present

## 2017-11-12 DIAGNOSIS — R634 Abnormal weight loss: Secondary | ICD-10-CM | POA: Diagnosis not present

## 2017-11-12 DIAGNOSIS — M4120 Other idiopathic scoliosis, site unspecified: Secondary | ICD-10-CM | POA: Diagnosis not present

## 2017-11-12 DIAGNOSIS — I10 Essential (primary) hypertension: Secondary | ICD-10-CM | POA: Diagnosis not present

## 2017-11-12 DIAGNOSIS — F411 Generalized anxiety disorder: Secondary | ICD-10-CM | POA: Diagnosis not present

## 2017-11-12 DIAGNOSIS — R269 Unspecified abnormalities of gait and mobility: Secondary | ICD-10-CM | POA: Diagnosis not present

## 2017-11-16 DIAGNOSIS — R269 Unspecified abnormalities of gait and mobility: Secondary | ICD-10-CM | POA: Diagnosis not present

## 2017-11-16 DIAGNOSIS — F411 Generalized anxiety disorder: Secondary | ICD-10-CM | POA: Diagnosis not present

## 2017-11-16 DIAGNOSIS — M4120 Other idiopathic scoliosis, site unspecified: Secondary | ICD-10-CM | POA: Diagnosis not present

## 2017-11-16 DIAGNOSIS — R634 Abnormal weight loss: Secondary | ICD-10-CM | POA: Diagnosis not present

## 2017-11-16 DIAGNOSIS — I10 Essential (primary) hypertension: Secondary | ICD-10-CM | POA: Diagnosis not present

## 2017-11-16 DIAGNOSIS — R54 Age-related physical debility: Secondary | ICD-10-CM | POA: Diagnosis not present

## 2017-11-18 ENCOUNTER — Ambulatory Visit: Payer: Medicare Other | Admitting: Podiatry

## 2017-11-19 DIAGNOSIS — M4120 Other idiopathic scoliosis, site unspecified: Secondary | ICD-10-CM | POA: Diagnosis not present

## 2017-11-19 DIAGNOSIS — R54 Age-related physical debility: Secondary | ICD-10-CM | POA: Diagnosis not present

## 2017-11-19 DIAGNOSIS — R634 Abnormal weight loss: Secondary | ICD-10-CM | POA: Diagnosis not present

## 2017-11-19 DIAGNOSIS — F411 Generalized anxiety disorder: Secondary | ICD-10-CM | POA: Diagnosis not present

## 2017-11-19 DIAGNOSIS — R269 Unspecified abnormalities of gait and mobility: Secondary | ICD-10-CM | POA: Diagnosis not present

## 2017-11-19 DIAGNOSIS — I10 Essential (primary) hypertension: Secondary | ICD-10-CM | POA: Diagnosis not present

## 2017-11-26 DIAGNOSIS — F411 Generalized anxiety disorder: Secondary | ICD-10-CM | POA: Diagnosis not present

## 2017-11-26 DIAGNOSIS — R54 Age-related physical debility: Secondary | ICD-10-CM | POA: Diagnosis not present

## 2017-11-26 DIAGNOSIS — I10 Essential (primary) hypertension: Secondary | ICD-10-CM | POA: Diagnosis not present

## 2017-11-26 DIAGNOSIS — R634 Abnormal weight loss: Secondary | ICD-10-CM | POA: Diagnosis not present

## 2017-11-26 DIAGNOSIS — R269 Unspecified abnormalities of gait and mobility: Secondary | ICD-10-CM | POA: Diagnosis not present

## 2017-11-26 DIAGNOSIS — M4120 Other idiopathic scoliosis, site unspecified: Secondary | ICD-10-CM | POA: Diagnosis not present

## 2017-11-30 DIAGNOSIS — R634 Abnormal weight loss: Secondary | ICD-10-CM | POA: Diagnosis not present

## 2017-11-30 DIAGNOSIS — M4120 Other idiopathic scoliosis, site unspecified: Secondary | ICD-10-CM | POA: Diagnosis not present

## 2017-11-30 DIAGNOSIS — R269 Unspecified abnormalities of gait and mobility: Secondary | ICD-10-CM | POA: Diagnosis not present

## 2017-11-30 DIAGNOSIS — F411 Generalized anxiety disorder: Secondary | ICD-10-CM | POA: Diagnosis not present

## 2017-11-30 DIAGNOSIS — I10 Essential (primary) hypertension: Secondary | ICD-10-CM | POA: Diagnosis not present

## 2017-11-30 DIAGNOSIS — R54 Age-related physical debility: Secondary | ICD-10-CM | POA: Diagnosis not present

## 2017-12-01 DIAGNOSIS — R54 Age-related physical debility: Secondary | ICD-10-CM | POA: Diagnosis not present

## 2017-12-01 DIAGNOSIS — R269 Unspecified abnormalities of gait and mobility: Secondary | ICD-10-CM | POA: Diagnosis not present

## 2017-12-01 DIAGNOSIS — Z79899 Other long term (current) drug therapy: Secondary | ICD-10-CM | POA: Diagnosis not present

## 2017-12-01 DIAGNOSIS — M4120 Other idiopathic scoliosis, site unspecified: Secondary | ICD-10-CM | POA: Diagnosis not present

## 2017-12-01 DIAGNOSIS — Z4689 Encounter for fitting and adjustment of other specified devices: Secondary | ICD-10-CM | POA: Diagnosis not present

## 2017-12-01 DIAGNOSIS — I10 Essential (primary) hypertension: Secondary | ICD-10-CM | POA: Diagnosis not present

## 2017-12-01 DIAGNOSIS — N814 Uterovaginal prolapse, unspecified: Secondary | ICD-10-CM | POA: Diagnosis not present

## 2017-12-01 DIAGNOSIS — R32 Unspecified urinary incontinence: Secondary | ICD-10-CM | POA: Diagnosis not present

## 2017-12-01 DIAGNOSIS — R634 Abnormal weight loss: Secondary | ICD-10-CM | POA: Diagnosis not present

## 2017-12-01 DIAGNOSIS — F411 Generalized anxiety disorder: Secondary | ICD-10-CM | POA: Diagnosis not present

## 2017-12-01 DIAGNOSIS — E538 Deficiency of other specified B group vitamins: Secondary | ICD-10-CM | POA: Diagnosis not present

## 2017-12-09 ENCOUNTER — Ambulatory Visit: Payer: Medicare Other | Admitting: Podiatry

## 2017-12-09 DIAGNOSIS — R269 Unspecified abnormalities of gait and mobility: Secondary | ICD-10-CM | POA: Diagnosis not present

## 2017-12-09 DIAGNOSIS — M4120 Other idiopathic scoliosis, site unspecified: Secondary | ICD-10-CM | POA: Diagnosis not present

## 2017-12-09 DIAGNOSIS — I10 Essential (primary) hypertension: Secondary | ICD-10-CM | POA: Diagnosis not present

## 2017-12-09 DIAGNOSIS — R634 Abnormal weight loss: Secondary | ICD-10-CM | POA: Diagnosis not present

## 2017-12-09 DIAGNOSIS — R54 Age-related physical debility: Secondary | ICD-10-CM | POA: Diagnosis not present

## 2017-12-09 DIAGNOSIS — F411 Generalized anxiety disorder: Secondary | ICD-10-CM | POA: Diagnosis not present

## 2018-01-02 ENCOUNTER — Other Ambulatory Visit: Payer: Self-pay

## 2018-01-02 ENCOUNTER — Emergency Department (HOSPITAL_COMMUNITY)
Admission: EM | Admit: 2018-01-02 | Discharge: 2018-01-02 | Disposition: A | Discharge status: Home / Self Care | Payer: Medicare Other | Attending: Emergency Medicine | Admitting: Emergency Medicine

## 2018-01-02 ENCOUNTER — Encounter (HOSPITAL_COMMUNITY): Payer: Self-pay | Admitting: Emergency Medicine

## 2018-01-02 DIAGNOSIS — I10 Essential (primary) hypertension: Secondary | ICD-10-CM | POA: Insufficient documentation

## 2018-01-02 DIAGNOSIS — Z79899 Other long term (current) drug therapy: Secondary | ICD-10-CM | POA: Insufficient documentation

## 2018-01-02 DIAGNOSIS — W19XXXA Unspecified fall, initial encounter: Secondary | ICD-10-CM

## 2018-01-02 DIAGNOSIS — Z043 Encounter for examination and observation following other accident: Secondary | ICD-10-CM | POA: Insufficient documentation

## 2018-01-02 DIAGNOSIS — Z87891 Personal history of nicotine dependence: Secondary | ICD-10-CM | POA: Insufficient documentation

## 2018-01-02 DIAGNOSIS — G8911 Acute pain due to trauma: Secondary | ICD-10-CM | POA: Diagnosis not present

## 2018-01-02 DIAGNOSIS — Y92129 Unspecified place in nursing home as the place of occurrence of the external cause: Secondary | ICD-10-CM

## 2018-01-02 DIAGNOSIS — R030 Elevated blood-pressure reading, without diagnosis of hypertension: Secondary | ICD-10-CM | POA: Diagnosis not present

## 2018-01-02 DIAGNOSIS — R4182 Altered mental status, unspecified: Secondary | ICD-10-CM | POA: Diagnosis not present

## 2018-01-02 NOTE — ED Provider Notes (Signed)
[facility]-EMERGENCY DEPT Provider Note: Lowella Dell, MD, FACEP  CSN: [id number] MRN: [id number] ARRIVAL: 01/02/18 at 0308 ROOM: WA21/WA21   CHIEF COMPLAINT  Fall   HISTORY OF PRESENT ILLNESS  01/02/18 4:46 AM Gina Zavala is a 84 y.o. female who was found lying on the floor on her back at her living facility. She states she was up to get coffee and accidentally fell backwards.  She denies any pain or injury.  Staff noted her blood pressure to be 226/103 and became concerned and sent her to the ED for evaluation.  On arrival her blood pressure is 190/56.  She denies complaint.   Past Medical History:  Diagnosis Date  . Hypertension   . Hypo-osmolality and hyponatremia     Past Surgical History:  Procedure Laterality Date  . HUMERUS FRACTURE SURGERY Left     No family history on file.  Social History   Tobacco Use  . Smoking status: Former Smoker    Packs/day: 0.30    Years: 1.00    Pack years: 0.30    Types: Cigarettes    Last attempt to quit: 01/13/1949    Years since quitting: 69.0  . Smokeless tobacco: Never Used  Substance Use Topics  . Alcohol use: No  . Drug use: No    Prior to Admission medications   Medication Sig Start Date End Date Taking? Authorizing Provider  acetaminophen (TYLENOL) 500 MG tablet Take 250 mg by mouth every 4 (four) hours as needed for moderate pain. Reported on 11/13/2015   Yes [provider]  Biotin 78295 MCG TABS Take 1 tablet by mouth every 3 (three) days.   Yes [provider]  losartan (COZAAR) 25 MG tablet Take 25 mg by mouth daily.   Yes [provider]  Lutein 20 MG CAPS Take 1 capsule by mouth as directed. Evern Bio, Thurs, Sat   Yes [provider]  Lutein 40 MG CAPS Take 1 capsule by mouth 3 (three) times a week. MWF   Yes [provider]  Multiple Vitamins-Minerals (CENTRUM SILVER 50+WOMEN) TABS Take 1 tablet by mouth as directed. MWF   Yes [provider]  trolamine  salicylate (ASPERCREME) 10 % cream Apply 1 application topically 2 (two) times daily.   Yes [provider]  zinc oxide (BALMEX) 11.3 % CREA cream Apply 1 application topically 2 (two) times daily.   Yes [provider]  amLODipine (NORVASC) 5 MG tablet Take 1 tablet (5 mg total) by mouth daily. Patient not taking: Reported on 01/02/2018 09/20/14   Tonye Pearson, MD    Allergies Patient has no known allergies.   REVIEW OF SYSTEMS  Negative except as noted here or in the History of Present Illness.   PHYSICAL EXAMINATION  Initial Vital Signs Blood pressure (!) 190/56, pulse 84, temperature 97.7 F (36.5 C), temperature source Oral, resp. rate 16, SpO2 96 %.  Examination General: Well-developed, thin female in no acute distress; appearance consistent with age of record HENT: normocephalic; atraumatic Eyes: pupils equal, round and reactive to light; extraocular muscles intact Neck: supple; nontender Heart: regular rate and rhythm Lungs: clear to auscultation bilaterally Abdomen: soft; nondistended; nontender; bowel sounds present Extremities: No deformity; no tenderness or pain on passive range of motion; pulses normal Neurologic: Awake, alert and oriented; motor function intact in all extremities and symmetric; no facial droop; hard of hearing Skin: Warm and dry Psychiatric: Normal mood and affect   RESULTS  Summary of this visit's results,  reviewed by myself:   EKG Interpretation  Date/Time:    Ventricular Rate:    PR Interval:    QRS Duration:   QT Interval:    QTC Calculation:   R Axis:     Text Interpretation:        Laboratory Studies: No results found for this or any previous visit (from the past 24 hour(s)). Imaging Studies: No results found.  ED COURSE  Nursing notes and initial vitals signs, including pulse oximetry, reviewed.  Vitals:   01/02/18 0424  BP: (!) 190/56  Pulse: 84  Resp: 16  Temp: 97.7 F (36.5 C)  TempSrc:  Oral  SpO2: 96%   Isolated systolic hypertension an elderly person is both common and difficult to treat.  The patient has a normal mental status with no acute complaints.  I do not believe any acute intervention is indicated at this time as she has a history of antihypertensive-induced hypotension and recently had a medication adjustment for this.  PROCEDURES    ED DIAGNOSES     ICD-10-CM   1. Fall at nursing home, initial encounter W19.XXXA    Y92.129   2. Systolic hypertension I10        Sundai Probert, MD 01/02/18 [phone]

## 2018-01-02 NOTE — ED Triage Notes (Signed)
Pt arriving via EMS from [facility]. Staff found pt laying in dining room floor on her back. Pt reports she was getting coffee, was "startled" and fell backwards. Pt denies any pain from fall but BP is elevated.

## 2018-01-02 NOTE — ED Notes (Addendum)
[facility] contacted for discharge transport back to [facility]

## 2018-01-02 NOTE — ED Notes (Signed)
Bed: WA21 Expected date:  Expected time:  Means of arrival:  Comments: EMS 84 yo female fall from [facility] BP 220 systolic

## 2018-01-21 ENCOUNTER — Encounter (HOSPITAL_COMMUNITY): Payer: Self-pay

## 2018-01-21 ENCOUNTER — Other Ambulatory Visit: Payer: Self-pay

## 2018-01-21 ENCOUNTER — Emergency Department (HOSPITAL_COMMUNITY): Payer: Medicare Other

## 2018-01-21 ENCOUNTER — Emergency Department (HOSPITAL_COMMUNITY)
Admission: EM | Admit: 2018-01-21 | Discharge: 2018-01-22 | Disposition: A | Discharge status: Home / Self Care | Payer: Medicare Other | Attending: Emergency Medicine | Admitting: Emergency Medicine

## 2018-01-21 DIAGNOSIS — I10 Essential (primary) hypertension: Secondary | ICD-10-CM | POA: Diagnosis not present

## 2018-01-21 DIAGNOSIS — F419 Anxiety disorder, unspecified: Secondary | ICD-10-CM | POA: Insufficient documentation

## 2018-01-21 DIAGNOSIS — E860 Dehydration: Secondary | ICD-10-CM | POA: Diagnosis not present

## 2018-01-21 DIAGNOSIS — R4182 Altered mental status, unspecified: Secondary | ICD-10-CM | POA: Diagnosis not present

## 2018-01-21 DIAGNOSIS — R0789 Other chest pain: Secondary | ICD-10-CM | POA: Insufficient documentation

## 2018-01-21 DIAGNOSIS — Z87891 Personal history of nicotine dependence: Secondary | ICD-10-CM | POA: Diagnosis not present

## 2018-01-21 DIAGNOSIS — R402441 Other coma, without documented Glasgow coma scale score, or with partial score reported, in the field [EMT or ambulance]: Secondary | ICD-10-CM | POA: Diagnosis not present

## 2018-01-21 DIAGNOSIS — R55 Syncope and collapse: Secondary | ICD-10-CM | POA: Diagnosis not present

## 2018-01-21 DIAGNOSIS — Z79899 Other long term (current) drug therapy: Secondary | ICD-10-CM | POA: Insufficient documentation

## 2018-01-21 LAB — COMPREHENSIVE METABOLIC PANEL
ALBUMIN: 3.5 g/dL (ref 3.5–5.0)
ALT: 16 U/L (ref 14–54)
AST: 21 U/L (ref 15–41)
Alkaline Phosphatase: 114 U/L (ref 38–126)
Anion gap: 11 (ref 5–15)
BILIRUBIN TOTAL: 0.4 mg/dL (ref 0.3–1.2)
BUN: 35 mg/dL — AB (ref 6–20)
CO2: 28 mmol/L (ref 22–32)
Calcium: 9.5 mg/dL (ref 8.9–10.3)
Chloride: 96 mmol/L — ABNORMAL LOW (ref 101–111)
Creatinine, Ser: 0.63 mg/dL (ref 0.44–1.00)
GFR calc Af Amer: >60 mL/min (ref >60)
GFR calc non Af Amer: >60 mL/min (ref >60)
Glucose, Bld: 99 mg/dL (ref 65–99)
POTASSIUM: 4.8 mmol/L (ref 3.5–5.1)
Sodium: 135 mmol/L (ref 135–145)
TOTAL PROTEIN: 6.4 g/dL — AB (ref 6.5–8.1)

## 2018-01-21 LAB — CBC
HEMATOCRIT: 41.1 % (ref 36.0–46.0)
HEMOGLOBIN: 13.3 g/dL (ref 12.0–15.0)
MCH: 29 pg (ref 26.0–34.0)
MCHC: 32.4 g/dL (ref 30.0–36.0)
MCV: 89.7 fL (ref 78.0–100.0)
Platelets: 455 10*3/uL — ABNORMAL HIGH (ref 150–400)
RBC: 4.58 MIL/uL (ref 3.87–5.11)
RDW: 15 % (ref 11.5–15.5)
WBC: 8.7 10*3/uL (ref 4.0–10.5)

## 2018-01-21 LAB — I-STAT TROPONIN, ED: Troponin i, poc: 0.01 ng/mL (ref 0.00–0.08)

## 2018-01-21 MED ORDER — SODIUM CHLORIDE 0.9 % IV BOLUS
1000.0000 mL | Freq: Once | INTRAVENOUS | Status: AC
  Administered 2018-01-21: 1000 mL via INTRAVENOUS

## 2018-01-21 NOTE — ED Triage Notes (Signed)
EMS reports from [facility] on [address], Repeated Syncopal episodes and altered mental status since 1400, some question as to legitimacy of episodes as she is easily roused. Has been given Xanax in increasing doses due to stated anxiety. Pt repeatedly requests medications. BP 170/90 HR 86 Resp 18 Sp02 94 RA CBG 109

## 2018-01-21 NOTE — ED Notes (Signed)
Bed: WA21 Expected date:  Expected time:  Means of arrival:  Comments: EMS syncope  

## 2018-01-21 NOTE — ED Provider Notes (Signed)
[facility]   CSN: [id number] Arrival date & time: 01/21/18  1733   History   Chief Complaint Chief Complaint  Patient presents with  . Near Syncope  . Altered Mental Status   HPI Gina Zavala is a 84 y.o. female.with hypertension and history of hyponatremia who presented with altered mental status. The patient was brought in by EMS from [facility] on [address] for repeated syncopal episodes and change in mental status over the course of the day starting 14:00.   The patient stated that she felt that her heart stopped last night, felt her pulse was faint, and had a tearing like chest discomfort that lasted a few minutes. The patient was very anxious and stated that she is worried that she had a heart attack and is going to die. She has recently been started on xanax by Dr. Kevan Ny. Patient's daughter mentions that the patient expressed to her repeatedly that she felt that she was going to die and she would subsequently become listless for a few minutes "almost as if she really did not have life in her."   The patient was recently seen on 01/02/18 for a fall and management of isolated systolic hypertension 190-200s.  Past Medical History:  Diagnosis Date  . Hypertension   . Hypo-osmolality and hyponatremia     Patient Active Problem List   Diagnosis Date Noted  . Dehydration 02/25/2017  . At risk for falling 09/01/2014  . Carotid bruit 01/14/2012  . Osteoporosis 01/14/2012  . HTN (hypertension) 09/01/2011  . Scoliosis 09/01/2011  . Hyponatremia 09/01/2011  . Uterine prolapse 09/01/2011    Past Surgical History:  Procedure Laterality Date  . HUMERUS FRACTURE SURGERY Left      OB History   None      Home Medications    Prior to Admission medications   Medication Sig Start Date End Date Taking? Authorizing Provider  ALPRAZolam (XANAX) 0.25 MG tablet Take 0.25 mg by mouth 2 (two) times daily. For anxiety   Yes [provider]  Biotin 40981 MCG TABS Take 1 tablet by mouth every 3 (three) days.   Yes [provider]  ENSURE (ENSURE) Take 237 mLs by mouth 2 (two) times daily.   Yes [provider]  losartan (COZAAR) 25 MG tablet Take 25 mg by mouth daily.   Yes [provider]  Lutein 20 MG CAPS Take 1 capsule by mouth as directed. Evern Bio, Thurs, Sat   Yes [provider]  Lutein 40 MG CAPS Take 1 capsule by mouth 3 (three) times a week. MWF   Yes [provider]  magic mouthwash SOLN Take 5 mLs by mouth 2 (two) times daily as needed for mouth pain. Every 12 hours as needed for burning of mouth/swish and spit   Yes [provider]  Multiple Vitamins-Minerals (CENTRUM SILVER 50+WOMEN) TABS Take 1 tablet by mouth as directed. MWF   Yes [provider]  trolamine salicylate (ASPERCREME) 10 % cream Apply 1 application topically 2 (two) times daily.   Yes [provider]  acetaminophen (TYLENOL) 500 MG tablet Take 250 mg by mouth every 4 (four) hours as needed for moderate pain. Reported on 11/13/2015    [provider]  zinc oxide (BALMEX) 11.3 % CREA cream Apply 1 application topically 2 (two) times daily.    [provider]    Family History History reviewed. No pertinent family history.  Social History Social History  Tobacco Use  . Smoking status: Former Smoker    Packs/day: 0.30    Years: 1.00    Pack years: 0.30    Types: Cigarettes    Last attempt to quit: 01/13/1949    Years since quitting: 69.0  . Smokeless tobacco: Never Used  Substance Use Topics  . Alcohol use: No  . Drug use: No     Allergies   Patient has no known allergies.   Review of Systems Review of Systems  Psychiatric/Behavioral: Positive for agitation. The patient is nervous/anxious.    Physical Exam Updated Vital Signs BP (!) 206/79   Pulse 87   Temp 98.1 F (36.7 C) (Oral)   Resp 16   SpO2 93%   Physical Exam    Constitutional: She appears well-developed and well-nourished.  HENT:  Head: Normocephalic and atraumatic.  Eyes: Conjunctivae are normal.  Cardiovascular: Normal rate and regular rhythm.  Murmur heard. Pulmonary/Chest: Effort normal and breath sounds normal. No stridor. No respiratory distress.  Abdominal: Soft. Bowel sounds are normal. She exhibits no distension. There is no tenderness.  Psychiatric: Her mood appears anxious. Her speech is rapid and/or pressured. She is not agitated. Cognition and memory are normal.    ED Treatments / Results  Labs (all labs ordered are listed, but only abnormal results are displayed) Labs Reviewed  CBC  TROPONIN I  COMPREHENSIVE METABOLIC PANEL   EKG None ST depression in lateral leads, old anterior infarct, normal sinus rhythm  Radiology No results found.  Procedures Procedures (including critical care time)  Medications Ordered in ED Medications - No data to display   Initial Impression / Assessment and Plan / ED Course  I have reviewed the triage vital signs and the nursing notes.  Pertinent labs & imaging results that were available during my care of the patient were reviewed by me and considered in my medical decision making (see chart for details).  Chest pain The patient mentioned that she had a few minutes of tearing chest pain last night 5/1 and also felt that her pulse was faint. EKG shows st depression in lateral leads without any t wave changes. Troponin negative. Chest xray showed atelectasis in left base.   Dehydration Patient appeared dehydrated on exam.   -Was started on IV NS  Generalized anxiety disorder The patient was extremely anxious to multiple situations throughout the encounter today. She was worried about having a heart attack, her losing her shoes, elevation of her blood pressure, worry about her dying, etc. The patient was recently started on xanax which may not be safe for her. Patient's dehydration  likely made her mental status worse.   -Talk to Dr. Kevan Ny about stopping xanax  -frequent reorientation   Final Clinical Impressions(s) / ED Diagnoses   Final diagnoses:  None    ED Discharge Orders    None       Lorenso Courier, MD 01/22/18 0013    Nira Conn, MD 01/22/18 [phone]

## 2018-01-22 DIAGNOSIS — R279 Unspecified lack of coordination: Secondary | ICD-10-CM | POA: Diagnosis not present

## 2018-01-22 DIAGNOSIS — Z743 Need for continuous supervision: Secondary | ICD-10-CM | POA: Diagnosis not present

## 2018-01-22 NOTE — ED Notes (Signed)
PTAR at bedside for transport pt remains stable

## 2018-01-22 NOTE — Discharge Instructions (Addendum)
It was a pleasure to take care of you Gina Zavala. You did not have a heart attack.You were given fluids. Please follow up with your doctor once you get discharged about your anxiety medication.

## 2018-01-28 ENCOUNTER — Encounter (HOSPITAL_COMMUNITY): Payer: Self-pay

## 2018-01-28 ENCOUNTER — Emergency Department (HOSPITAL_COMMUNITY)
Admission: EM | Admit: 2018-01-28 | Discharge: 2018-01-29 | Disposition: A | Discharge status: Home / Self Care | Payer: Medicare Other | Attending: Emergency Medicine | Admitting: Emergency Medicine

## 2018-01-28 ENCOUNTER — Emergency Department (HOSPITAL_COMMUNITY): Payer: Medicare Other

## 2018-01-28 ENCOUNTER — Other Ambulatory Visit: Payer: Self-pay

## 2018-01-28 DIAGNOSIS — R4182 Altered mental status, unspecified: Secondary | ICD-10-CM | POA: Insufficient documentation

## 2018-01-28 DIAGNOSIS — Z79899 Other long term (current) drug therapy: Secondary | ICD-10-CM | POA: Diagnosis not present

## 2018-01-28 DIAGNOSIS — S199XXA Unspecified injury of neck, initial encounter: Secondary | ICD-10-CM | POA: Diagnosis not present

## 2018-01-28 DIAGNOSIS — I10 Essential (primary) hypertension: Secondary | ICD-10-CM | POA: Insufficient documentation

## 2018-01-28 DIAGNOSIS — Z87891 Personal history of nicotine dependence: Secondary | ICD-10-CM | POA: Diagnosis not present

## 2018-01-28 DIAGNOSIS — E875 Hyperkalemia: Secondary | ICD-10-CM | POA: Diagnosis not present

## 2018-01-28 DIAGNOSIS — R450 Nervousness: Secondary | ICD-10-CM | POA: Diagnosis not present

## 2018-01-28 DIAGNOSIS — R030 Elevated blood-pressure reading, without diagnosis of hypertension: Secondary | ICD-10-CM | POA: Diagnosis not present

## 2018-01-28 DIAGNOSIS — I447 Left bundle-branch block, unspecified: Secondary | ICD-10-CM | POA: Diagnosis not present

## 2018-01-28 DIAGNOSIS — F419 Anxiety disorder, unspecified: Secondary | ICD-10-CM | POA: Diagnosis not present

## 2018-01-28 DIAGNOSIS — R05 Cough: Secondary | ICD-10-CM | POA: Diagnosis not present

## 2018-01-28 DIAGNOSIS — R413 Other amnesia: Secondary | ICD-10-CM | POA: Diagnosis not present

## 2018-01-28 DIAGNOSIS — F22 Delusional disorders: Secondary | ICD-10-CM | POA: Diagnosis not present

## 2018-01-28 DIAGNOSIS — S0990XA Unspecified injury of head, initial encounter: Secondary | ICD-10-CM | POA: Diagnosis not present

## 2018-01-28 DIAGNOSIS — R4689 Other symptoms and signs involving appearance and behavior: Secondary | ICD-10-CM

## 2018-01-28 LAB — COMPREHENSIVE METABOLIC PANEL
ALK PHOS: 133 U/L — AB (ref 38–126)
ALT: 16 U/L (ref 14–54)
AST: 28 U/L (ref 15–41)
Albumin: 3.4 g/dL — ABNORMAL LOW (ref 3.5–5.0)
Anion gap: 8 (ref 5–15)
BILIRUBIN TOTAL: 0.7 mg/dL (ref 0.3–1.2)
BUN: 20 mg/dL (ref 6–20)
CALCIUM: 9.5 mg/dL (ref 8.9–10.3)
CO2: 32 mmol/L (ref 22–32)
CREATININE: 0.54 mg/dL (ref 0.44–1.00)
Chloride: 91 mmol/L — ABNORMAL LOW (ref 101–111)
GFR calc Af Amer: >60 mL/min (ref >60)
GFR calc non Af Amer: >60 mL/min (ref >60)
GLUCOSE: 103 mg/dL — AB (ref 65–99)
POTASSIUM: 5.4 mmol/L — AB (ref 3.5–5.1)
Sodium: 131 mmol/L — ABNORMAL LOW (ref 135–145)
TOTAL PROTEIN: 6.7 g/dL (ref 6.5–8.1)

## 2018-01-28 LAB — CBC
HEMATOCRIT: 42.5 % (ref 36.0–46.0)
Hemoglobin: 14.2 g/dL (ref 12.0–15.0)
MCH: 30.2 pg (ref 26.0–34.0)
MCHC: 33.4 g/dL (ref 30.0–36.0)
MCV: 90.4 fL (ref 78.0–100.0)
Platelets: 357 10*3/uL (ref 150–400)
RBC: 4.7 MIL/uL (ref 3.87–5.11)
RDW: 14.9 % (ref 11.5–15.5)
WBC: 6.4 10*3/uL (ref 4.0–10.5)

## 2018-01-28 LAB — URINALYSIS, ROUTINE W REFLEX MICROSCOPIC
Bilirubin Urine: NEGATIVE
Glucose, UA: NEGATIVE mg/dL
Hgb urine dipstick: NEGATIVE
Ketones, ur: 5 mg/dL — AB
LEUKOCYTES UA: NEGATIVE
NITRITE: NEGATIVE
PH: 7 (ref 5.0–8.0)
Protein, ur: NEGATIVE mg/dL
SPECIFIC GRAVITY, URINE: 1.013 (ref 1.005–1.030)

## 2018-01-28 LAB — PROTIME-INR
INR: 0.9
PROTHROMBIN TIME: 12.1 s (ref 11.4–15.2)

## 2018-01-28 LAB — CBG MONITORING, ED: Glucose-Capillary: 104 mg/dL — ABNORMAL HIGH (ref 65–99)

## 2018-01-28 MED ORDER — CLONAZEPAM 0.5 MG PO TABS
0.5000 mg | ORAL_TABLET | Freq: Every day | ORAL | Status: DC
  Administered 2018-01-29: 0.5 mg via ORAL
  Filled 2018-01-28 (×3): qty 1

## 2018-01-28 MED ORDER — LOSARTAN POTASSIUM 25 MG PO TABS: 25.0000 mg | ORAL_TABLET | Freq: Every day | ORAL | Status: DC

## 2018-01-28 MED ORDER — SODIUM CHLORIDE 0.9 % IV BOLUS: 250.0000 mL | Freq: Once | INTRAVENOUS | Status: DC

## 2018-01-28 MED ORDER — ACETAMINOPHEN 500 MG PO TABS: 250.0000 mg | ORAL_TABLET | ORAL | Status: DC | PRN

## 2018-01-28 MED ORDER — LUTEIN 20 MG PO CAPS: 1.0000 | ORAL_CAPSULE | ORAL | Status: DC

## 2018-01-28 MED ORDER — MAGIC MOUTHWASH
5.0000 mL | Freq: Two times a day (BID) | ORAL | Status: DC | PRN
  Filled 2018-01-28: qty 5

## 2018-01-28 MED ORDER — MUSCLE RUB 10-15 % EX CREA
1.0000 "application " | TOPICAL_CREAM | Freq: Two times a day (BID) | CUTANEOUS | Status: DC
  Filled 2018-01-28: qty 85

## 2018-01-28 MED ORDER — ENSURE ENLIVE PO LIQD
237.0000 mL | Freq: Two times a day (BID) | ORAL | Status: DC
  Filled 2018-01-28 (×2): qty 237

## 2018-01-28 MED ORDER — ADULT MULTIVITAMIN W/MINERALS CH: 1.0000 | ORAL_TABLET | ORAL | Status: DC

## 2018-01-28 NOTE — ED Notes (Signed)
Pt states that she is dying. I attempted to redirect the pt and inquire about getting a urine sample. Pt states that she is unable and reverted back to talking about dying.

## 2018-01-28 NOTE — ED Notes (Signed)
Pt states she can not use the bathroom right now because of she is dying. I tried to comfort the pt to tell her she will be alright and nothing bad is going to happen to her.

## 2018-01-28 NOTE — ED Notes (Addendum)
Pulse ox having difficulty capturing o2 sat. Pt given heat pack to warm up fingers. Pt refuses oxygen via [place] at this time.  Pt has normal respirations and is speaking in full sentences.

## 2018-01-28 NOTE — ED Notes (Signed)
Pt unable to give urine

## 2018-01-28 NOTE — ED Provider Notes (Addendum)
Patient care was signed out to me by Margarita Mail, PA-C at shift change.  Currently pending UA and TTS consult. Plan is to follow up on the results of the UA and TTS evaluation. If UA negative and TTS clears patient then Kenton Kingfisher feels that the patient is safe for discharge back to her facility.   In brief, patient has had some behavioral changes over the past 2 weeks.  She is a resident at [facility].  Her CBC is without leukocytosis or anemia.  Her CMP shows some electrolyte derangement with mild hyponatremia.  She does have mild hyperkalemia.  Normal creatinine and liver enzymes.  Alk phos slightly elevated 133.  PT/INR is within normal limits.  UA is negative for UTI.  CT head negative for any acute abnormality.  She does have some facet hypertrophy in the cervical spine as well as a compression deformity at T2 however this is thought to be an old injury.  ECG was reviewed by Dr. Wilson Singer. He states that no acute intervention for her hyperkalemia is necessary at this time. ECG read suggests acute pericarditis but feel that this is unlikely given patient is asymptomatic.   On evaluation of patient she has no complaints other than she does not want to go back to her facility. She denies chest pain or shortness of breath.   Physical Exam  Constitutional:  Frail appearing, no acute distress  HENT:  Head: Normocephalic and atraumatic.  Eyes: Conjunctivae are normal.  Neck: Neck supple.  Cardiovascular: Normal rate.  Pulmonary/Chest: Effort normal. No respiratory distress.  Musculoskeletal: Normal range of motion.  Neurological: She is alert.  Skin: Skin is warm and dry.  Nursing note and vitals reviewed.  Pt medically cleared for tts consult.   Behavioral health evaluated the patient and feels patient meets inpatient criteria. They are currently awaiting placement to geri-psych.  Dr. Leonides Schanz is aware of patients change in vital signs. Patient has not received her home hypertension medications.  Patient will be given a dose of hypertension medications and reassess the pt.   Rodney Booze, PA-C 01/29/18 0133    Rodney Booze, PA-C 01/29/18 0214    Valarie Merino, MD 02/01/18 1150

## 2018-01-28 NOTE — ED Provider Notes (Signed)
[facility] Provider Note   CSN: [id number] Arrival date & time: 01/28/18  1138     History   Chief Complaint Chief Complaint  Patient presents with  . Paranoid    HPI Gina Zavala is a 84 y.o. female who presents to the ED, sent in by [facility] living facility for Paranoid behavior. The patient had a fall 2 weeks ago.  According to both the daughter and Gina Zavala the nurse at [facility] she has had an acute change in her behavior since that time. The patient states that they are depriving her of food and water, and that they want to kill her.  She states "I am going to die."  She states "look at the bruises on my arms."  According to the nurse the patient has been extremely anxious.  She is refusing to walk to the bathroom and has demanded that people pick her up and carry her.  She is however able to walk to the nurse's office and tell her that she is being poisoned.  The patient was taking Xanax prescribed by her PCP however the nurse states that she was getting "loopy" and they changed her to Klonopin.  She does not like, Klonopin and has been very agitated about the magic medication change.  Patient has been refusing to eat or drink.  She has been threatening the staff saying that she is going to report them for abusing her.  Patient daughter states that this has been an ongoing issue and that her behavior has been extremely difficult to manage.  Her daughter states that she is lucid and competent and that she frequently has manipulative behavior.  Her daughter postulates that the patient is dealing with her severe anxiety and that the fall she had the other day scared her.  She also thinks that the patient wants to have somebody near to her 24 hours a day because she is facing her mortality and that this is resulting in her worsening behavior.  Her daughter states that yesterday the patient called her 8 times and was able to walk to the phone to do so.  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  Patient has been seen within the past 5 days and had a complete work-up.  She she does not appear to have any medical cause of her behavior changes. HPI  Past Medical History:  Diagnosis Date  . Hypertension   . Hypo-osmolality and hyponatremia     Patient Active Problem List   Diagnosis Date Noted  . Dehydration 02/25/2017  . At risk for falling 09/01/2014  . Carotid bruit 01/14/2012  . Osteoporosis 01/14/2012  . HTN (hypertension) 09/01/2011  . Scoliosis 09/01/2011  . Hyponatremia 09/01/2011  . Uterine prolapse 09/01/2011    Past Surgical History:  Procedure Laterality Date  . HUMERUS FRACTURE SURGERY Left      OB History   None      Home Medications    Prior to Admission medications   Medication Sig Start Date End Date Taking? Authorizing Provider  acetaminophen (TYLENOL) 500 MG tablet Take 250 mg by mouth  every 4 (four) hours as needed for moderate pain. Reported on 11/13/2015   Yes [provider]  Biotin 16109 MCG TABS Take 1 tablet by mouth every 3 (three) days.   Yes [provider]  clonazePAM (KLONOPIN) 1 MG tablet Take 0.5 mg by mouth at bedtime.   Yes [provider]  ENSURE (ENSURE) Take 237 mLs by mouth 2 (two) times daily.   Yes [provider]  losartan (COZAAR) 25 MG tablet Take 25 mg by mouth daily.   Yes [provider]  Lutein 20 MG CAPS Take 1 capsule by mouth as directed. Evern Bio, Thurs, Sat   Yes [provider]  Lutein 40 MG CAPS Take 1 capsule by mouth 3 (three) times a week. MWF   Yes [provider]  magic mouthwash SOLN Take 5  mLs by mouth 2 (two) times daily as needed for mouth pain. Every 12 hours as needed for burning of mouth/swish and spit   Yes [provider]  Multiple Vitamins-Minerals (CENTRUM SILVER 50+WOMEN) TABS Take 1 tablet by mouth as directed. MWF   Yes [provider]  trolamine salicylate (ASPERCREME) 10 % cream Apply 1 application topically 2 (two) times daily.   Yes [provider]  zinc oxide (BALMEX) 11.3 % CREA cream Apply 1 application topically daily as needed.    Yes [provider]    Family History History reviewed. No pertinent family history.  Social History Social History   Tobacco Use  . Smoking status: Former Smoker    Packs/day: 0.30    Years: 1.00    Pack years: 0.30    Types: Cigarettes    Last attempt to quit: 01/13/1949    Years since quitting: 69.0  . Smokeless tobacco: Never Used  Substance Use Topics  . Alcohol use: No  . Drug use: No     Allergies   Patient has no known allergies.   Review of Systems Review of Systems  Ten systems reviewed and are negative for acute change, except as noted in the HPI.  Physical Exam Updated Vital Signs BP (!) 134/99   Pulse 64   Temp 97.9 F (36.6 C) (Oral)   Resp 16   SpO2 97%   Physical Exam  Constitutional: She is oriented to person, place, and time. She appears well-developed and well-nourished. No distress.  HENT:  Head: Normocephalic and atraumatic.  Eyes: Conjunctivae are normal. No scleral icterus.  Neck: Normal range of motion.  Cardiovascular: Normal rate, regular rhythm and normal heart sounds. Exam reveals no gallop and no friction rub.  No murmur heard. Pulmonary/Chest: Effort normal and breath sounds normal. No respiratory distress.  Abdominal: Soft. Bowel sounds are normal. She exhibits no distension and no mass. There is no tenderness. There is no guarding.  Neurological: She is alert and oriented to person, place, and time.  Skin: Skin is warm and dry. She is  not diaphoretic.  Psychiatric: Her mood appears anxious. Her speech is rapid and/or pressured. Thought content is paranoid.  Nursing note and vitals reviewed.    ED Treatments / Results  Labs (all labs ordered are listed, but only abnormal results are displayed) Labs Reviewed  COMPREHENSIVE METABOLIC PANEL - Abnormal; Notable for the following components:      Result Value   Sodium 131 (*)    Potassium 5.4 (*)    Chloride 91 (*)    Glucose, Bld 103 (*)    Albumin 3.4 (*)    Alkaline  Phosphatase 133 (*)    All other components within normal limits  CBG MONITORING, ED - Abnormal; Notable for the following components:   Glucose-Capillary 104 (*)    All other components within normal limits  CBC  PROTIME-INR  URINALYSIS, ROUTINE W REFLEX MICROSCOPIC    EKG None  Radiology Ct Head Wo Contrast  Result Date: 01/28/2018 CLINICAL DATA:  Altered mental status and head trauma. EXAM: CT HEAD WITHOUT CONTRAST CT CERVICAL SPINE WITHOUT CONTRAST TECHNIQUE: Multidetector CT imaging of the head and cervical spine was performed following the standard protocol without intravenous contrast. Multiplanar CT image reconstructions of the cervical spine were also generated. COMPARISON:  CT head and cervical spine 08/02/2015 FINDINGS: CT HEAD FINDINGS Brain: No mass lesion, intraparenchymal hemorrhage or extra-axial collection. No evidence of acute cortical infarct. There is an old right cerebellar infarct. There is periventricular hypoattenuation compatible with chronic microvascular disease. Vascular: No hyperdense vessel or unexpected calcification. Skull: Normal visualized skull base, calvarium and extracranial soft tissues. Sinuses/Orbits: Complete opacification of both frontal sinuses. Normal orbits. CT CERVICAL SPINE FINDINGS Alignment: Grade 1 C4-C5 anterolisthesis, unchanged. Skull base and vertebrae: No acute fracture the cervical spine. There is approximately 50% height loss centrally of the T2  vertebral body which is new compared to 08/02/2015, with no specific features to indicate acuity. Soft tissues and spinal canal: No prevertebral fluid or swelling. No visible canal hematoma. Disc levels: Severe right C4-C5 facet hypertrophy and severe left C3-C4 and C4-C5 facet hypertrophy. No bony spinal canal stenosis. Upper chest: No pneumothorax, pulmonary nodule or pleural effusion. Other: Normal visualized paraspinal cervical soft tissues. IMPRESSION: 1. No acute intracranial abnormality. Old right cerebellar infarct and chronic small vessel disease. 2. Complete opacification of both frontal sinuses, likely chronic sinusitis. 3. No acute fracture of the cervical spine. 4. Severe facet hypertrophy at C3-4 and C4-C5 with resultant grade 1 C4-5 anterolisthesis, unchanged. 5. Compression deformity of the T2 vertebral body with approximately 50% height loss has developed since the cervical spine CT of 08/02/2015. There are no specific fractures to indicate acuity and this is probably an old injury. Electronically Signed   By: Deatra Robinson M.D.   On: 01/28/2018 14:08   Ct Cervical Spine Wo Contrast  Result Date: 01/28/2018 CLINICAL DATA:  Altered mental status and head trauma. EXAM: CT HEAD WITHOUT CONTRAST CT CERVICAL SPINE WITHOUT CONTRAST TECHNIQUE: Multidetector CT imaging of the head and cervical spine was performed following the standard protocol without intravenous contrast. Multiplanar CT image reconstructions of the cervical spine were also generated. COMPARISON:  CT head and cervical spine 08/02/2015 FINDINGS: CT HEAD FINDINGS Brain: No mass lesion, intraparenchymal hemorrhage or extra-axial collection. No evidence of acute cortical infarct. There is an old right cerebellar infarct. There is periventricular hypoattenuation compatible with chronic microvascular disease. Vascular: No hyperdense vessel or unexpected calcification. Skull: Normal visualized skull base, calvarium and extracranial soft  tissues. Sinuses/Orbits: Complete opacification of both frontal sinuses. Normal orbits. CT CERVICAL SPINE FINDINGS Alignment: Grade 1 C4-C5 anterolisthesis, unchanged. Skull base and vertebrae: No acute fracture the cervical spine. There is approximately 50% height loss centrally of the T2 vertebral body which is new compared to 08/02/2015, with no specific features to indicate acuity. Soft tissues and spinal canal: No prevertebral fluid or swelling. No visible canal hematoma. Disc levels: Severe right C4-C5 facet hypertrophy and severe left C3-C4 and C4-C5 facet hypertrophy. No bony spinal canal stenosis. Upper chest: No pneumothorax, pulmonary nodule or pleural effusion. Other: Normal visualized paraspinal cervical soft tissues.  IMPRESSION: 1. No acute intracranial abnormality. Old right cerebellar infarct and chronic small vessel disease. 2. Complete opacification of both frontal sinuses, likely chronic sinusitis. 3. No acute fracture of the cervical spine. 4. Severe facet hypertrophy at C3-4 and C4-C5 with resultant grade 1 C4-5 anterolisthesis, unchanged. 5. Compression deformity of the T2 vertebral body with approximately 50% height loss has developed since the cervical spine CT of 08/02/2015. There are no specific fractures to indicate acuity and this is probably an old injury. Electronically Signed   By: Deatra Robinson M.D.   On: 01/28/2018 14:08    Procedures Procedures (including critical care time)  Medications Ordered in ED Medications - No data to display   Initial Impression / Assessment and Plan / ED Course  I have reviewed the triage vital signs and the nursing notes.  Pertinent labs & imaging results that were available during my care of the patient were reviewed by me and considered in my medical decision making (see chart for details).  Clinical Course as of Jan 29 1803  Thu Jan 28, 2018  1355 Potassium(!): 5.4 [AH]    Clinical Course User Index [AH] Arthor Captain, PA-C    Patient here with change in her behavioral status over the past 2 weeks.  I have given sign out to PA Couture who will assume care of the patient . I have placed a tts consult.  Final Clinical Impressions(s) / ED Diagnoses   Final diagnoses:  Paranoia [facility])    ED Discharge Orders    None       Arthor Captain, PA-C 01/28/18 1804    Raeford Razor, MD 01/29/18 [phone]

## 2018-01-28 NOTE — BH Assessment (Signed)
Tele Assessment Note   Patient Name: Gina Zavala MRN: [id number] Referring Physician: Arthor Captain, PA-C Location of [facility]: [facility] Location of Provider: Behavioral Health TTS Department  Gina Zavala is an 84 y.o. female present to [facility] from her nursing home [place]. The nursing home complains patient refuses to walk to the bathroom, but is able to walk, threatening staff to report to APS, and states staff is trying to kill her. Patient report her nurse is trying to kill her and she adding additional medication to her applesauce and feeling it to her. Report they are only feeding her once a day and refusing to give her water or other food expressing they are too busy. Patient crying during the assessment stating she knows she dying and she blames her nurse at the nursing home for killing her slowly. Report the nurse would call the doctor requesting additional medication to give her such as Tylenol, patient state,"I do not need no Tylenol." Patient report her nurse is jealous of her because she's healthy therefore she trying to kill her. Patient denies SI, HI and AVH but report she feels her nursing at the nursing is trying to kill her slowly.   Disposition: [name], DNP, recommend inpt gero-psych    Diagnosis:  F22    Delusional disorder   Past Medical History:  Past Medical History:  Diagnosis Date  . Hypertension   . Hypo-osmolality and hyponatremia     Past Surgical History:  Procedure Laterality Date  . HUMERUS FRACTURE SURGERY Left     Family History: History reviewed. No pertinent family history.  Social History:  reports that she quit smoking about 69 years ago. Her smoking use included cigarettes. She has a 0.30 pack-year smoking history. She has never used smokeless tobacco. She reports that she does not drink alcohol or use drugs.  Additional Social History:  Alcohol / Drug Use Pain Medications: see MAR Prescriptions: see MAR Over the Counter: see  MAR History of alcohol / drug use?: No history of alcohol / drug abuse Longest period of sobriety (when/how long): n/a  CIWA: CIWA-Ar BP: (!) 134/99 Pulse Rate: 64 COWS:    Allergies: No Known Allergies  Home Medications:  (Not in a hospital admission)  OB/GYN Status:  No LMP recorded. Patient is postmenopausal.  General Assessment Data Location of Assessment: [facility] TTS Assessment: In system Is this a Tele or Face-to-Face Assessment?: Face-to-Face Is this an Initial Assessment or a Re-assessment for this encounter?: Initial Assessment Marital status: Widowed Living Arrangements: Other (Comment)([facility]) Can pt return to current living arrangement?: Yes Admission Status: Voluntary Is patient capable of signing voluntary admission?: Yes Referral Source: Other(nursing facillity ) Insurance type: Medicare     Crisis Care Plan Living Arrangements: Other (Comment)([facility]) Legal Guardian: Other:(self) Name of Psychiatrist: unknown Name of Therapist: unknown  Education Status Is patient currently in school?: No  Risk to self with the past 6 months Suicidal Ideation: No Has patient been a risk to self within the past 6 months prior to admission? : No Suicidal Intent: No Has patient had any suicidal intent within the past 6 months prior to admission? : No Is patient at risk for suicide?: No Suicidal Plan?: No Has patient had any suicidal plan within the past 6 months prior to admission? : No Access to Means: No What has been your use of drugs/alcohol within the last 12 months?: pt denies Previous Attempts/Gestures: No How many times?: (unknown) Other Self Harm Risks:  none report  Triggers for Past Attempts: None known Intentional Self Injurious Behavior: None Family Suicide History: Unknown Recent stressful life event(s): Other (Comment)(unknown) Persecutory voices/beliefs?: No Depression: Yes Depression Symptoms: Feeling worthless/self  pity, Tearfulness Substance abuse history and/or treatment for substance abuse?: No Suicide prevention information given to non-admitted patients: Not applicable  Risk to Others within the past 6 months Homicidal Ideation: No Does patient have any lifetime risk of violence toward others beyond the six months prior to admission? : No Thoughts of Harm to Others: No Current Homicidal Intent: No Current Homicidal Plan: No Access to Homicidal Means: No Identified Victim: none known History of harm to others?: No Assessment of Violence: None Noted Violent Behavior Description: none known Does patient have access to weapons?: No Criminal Charges Pending?: No Does patient have a court date: No Is patient on probation?: No  Psychosis Hallucinations: None noted Delusions: Unspecified  Mental Status Report Appearance/Hygiene: In scrubs Eye Contact: Fair Motor Activity: Unsteady Speech: Logical/coherent Level of Consciousness: Alert Mood: Depressed, Anxious, Suspicious, Fearful Affect: Anxious Anxiety Level: Minimal Thought Processes: Flight of Ideas Judgement: Impaired Orientation: Person, Place, Time, Situation Obsessive Compulsive Thoughts/Behaviors: None  Cognitive Functioning Memory: Recent Impaired, Remote Impaired Is patient IDD: No Is patient DD?: No Insight: Poor Impulse Control: Fair Appetite: Poor Have you had any weight changes? : No Change Sleep: Unable to Assess Vegetative Symptoms: None  ADLScreening [facility] Assessment Services) Patient's cognitive ability adequate to safely complete daily activities?: No Patient able to express need for assistance with ADLs?: No Independently performs ADLs?: No  Prior Inpatient Therapy Prior Inpatient Therapy: No  Prior Outpatient Therapy Prior Outpatient Therapy: No Does patient have an ACCT team?: No Does patient have Intensive In-House Services?  : No Does patient have [facility] services? : No Does patient have P4CC  services?: No  ADL Screening (condition at time of admission) Patient's cognitive ability adequate to safely complete daily activities?: No Is the patient deaf or have difficulty hearing?: Yes Does the patient have difficulty seeing, even when wearing glasses/contacts?: Yes Does the patient have difficulty concentrating, remembering, or making decisions?: Yes Patient able to express need for assistance with ADLs?: No Does the patient have difficulty dressing or bathing?: Yes Independently performs ADLs?: No Dressing (OT): Needs assistance Grooming: Needs assistance Bathing: Needs assistance Toileting: Needs assistance In/Out Bed: Needs assistance Does the patient have difficulty walking or climbing stairs?: Yes       Abuse/Neglect Assessment (Assessment to be complete while patient is alone) Abuse/Neglect Assessment Can Be Completed: Unable to assess, patient is non-responsive or altered mental status     Advance Directives (For Healthcare) Does Patient Have a Medical Advance Directive?: No Would patient like information on creating a medical advance directive?: No - Patient declined          Disposition:  Disposition Initial Assessment Completed for this Encounter: Yes(Jamison Lord, DNP, recommend gero-psych) Patient referred to: Other (Comment)   Shaquila Sigman 01/28/2018 5:24 PM

## 2018-01-28 NOTE — ED Triage Notes (Signed)
EMS reports from [facility], Staff states increasingly paranoid with bouts of AMS. Facility had pt on Xanax to calm and control,, and two days ago switched to Clonazapam, Pt now feels paranoid, states facility is poisoning her, repeatedly asks for medications, complains about facilty, and states she is going to die. Pt feigns syncopal episodes, but will respond when engaged.  BP 179/73 HR 68 Resp 16 Sp02 CBG 106

## 2018-01-29 ENCOUNTER — Emergency Department (HOSPITAL_COMMUNITY): Payer: Medicare Other

## 2018-01-29 DIAGNOSIS — Z87891 Personal history of nicotine dependence: Secondary | ICD-10-CM | POA: Diagnosis not present

## 2018-01-29 DIAGNOSIS — R413 Other amnesia: Secondary | ICD-10-CM | POA: Diagnosis not present

## 2018-01-29 DIAGNOSIS — R450 Nervousness: Secondary | ICD-10-CM | POA: Diagnosis not present

## 2018-01-29 DIAGNOSIS — F419 Anxiety disorder, unspecified: Secondary | ICD-10-CM

## 2018-01-29 DIAGNOSIS — M2550 Pain in unspecified joint: Secondary | ICD-10-CM | POA: Diagnosis not present

## 2018-01-29 DIAGNOSIS — F22 Delusional disorders: Secondary | ICD-10-CM | POA: Diagnosis present

## 2018-01-29 DIAGNOSIS — R05 Cough: Secondary | ICD-10-CM | POA: Diagnosis not present

## 2018-01-29 DIAGNOSIS — Z7401 Bed confinement status: Secondary | ICD-10-CM | POA: Diagnosis not present

## 2018-01-29 MED ORDER — QUETIAPINE FUMARATE 25 MG PO TABS: 25.0000 mg | ORAL_TABLET | Freq: Two times a day (BID) | ORAL | 0 refills | Status: AC | PRN

## 2018-01-29 MED ORDER — QUETIAPINE FUMARATE 25 MG PO TABS
25.0000 mg | ORAL_TABLET | Freq: Two times a day (BID) | ORAL | Status: DC | PRN
Start: 2018-01-29 — End: 2018-01-30

## 2018-01-29 MED ORDER — LORAZEPAM 2 MG/ML IJ SOLN
1.0000 mg | Freq: Once | INTRAMUSCULAR | Status: AC
  Administered 2018-01-29: 1 mg via INTRAMUSCULAR
  Filled 2018-01-29: qty 1

## 2018-01-29 MED ORDER — LOSARTAN POTASSIUM 25 MG PO TABS
25.0000 mg | ORAL_TABLET | Freq: Every day | ORAL | Status: DC
  Administered 2018-01-29: 25 mg via ORAL
  Filled 2018-01-29: qty 1

## 2018-01-29 MED ORDER — DIVALPROEX SODIUM 125 MG PO CSDR: 125.0000 mg | DELAYED_RELEASE_CAPSULE | Freq: Two times a day (BID) | ORAL | 0 refills | Status: AC

## 2018-01-29 MED ORDER — DIVALPROEX SODIUM 125 MG PO CSDR
125.0000 mg | DELAYED_RELEASE_CAPSULE | Freq: Two times a day (BID) | ORAL | Status: DC
Start: 2018-01-29 — End: 2018-01-30
  Filled 2018-01-29 (×2): qty 1

## 2018-01-29 NOTE — ED Notes (Signed)
Bed: WA29 Expected date:  Expected time:  Means of arrival:  Comments: Hold for Room 3

## 2018-01-29 NOTE — ED Notes (Signed)
PTAR notified of need for transportation.  

## 2018-01-29 NOTE — Consult Note (Addendum)
Welcome Psychiatry Consult   Reason for Consult:  Paranoia  Referring Physician:  EDP Patient Identification: Gina Zavala MRN:  [id number] Principal Diagnosis: Paranoia [facility]) Diagnosis:   Patient Active Problem List   Diagnosis Date Noted  . Paranoia ([place]) [F22] 01/29/2018    Priority: High  . Dehydration [E86.0] 02/25/2017  . At risk for falling [Z91.81] 09/01/2014  . Carotid bruit [R09.89] 01/14/2012  . Osteoporosis [M81.0] 01/14/2012  . HTN (hypertension) [I10] 09/01/2011  . Scoliosis [M41.9] 09/01/2011  . Hyponatremia [E87.1] 09/01/2011  . Uterine prolapse [N81.4] 09/01/2011    Total Time spent with patient: 45 minutes  Subjective:   Gina Zavala is a 84 y.o. female patient does not warrant admission.  HPI:  84 yo female who presented to the ED with paranoia from her skilled nursing facility.  She felt the staff was trying to poison her.  Today, she is taking her medications and eating without issues.  No suicidal/homicidal ideations, hallucinations, or substance abuse.  Focused on getting more food to eat, anxious about this.  Medications adjusted.  Past Psychiatric History: none   Risk to Self: Suicidal Ideation: No Suicidal Intent: No Is patient at risk for suicide?: No Suicidal Plan?: No Access to Means: No What has been your use of drugs/alcohol within the last 12 months?: pt denies How many times?: (unknown) Other Self Harm Risks: none report  Triggers for Past Attempts: None known Intentional Self Injurious Behavior: None Risk to Others: Homicidal Ideation: No Thoughts of Harm to Others: No Current Homicidal Intent: No Current Homicidal Plan: No Access to Homicidal Means: No Identified Victim: none known History of harm to others?: No Assessment of Violence: None Noted Violent Behavior Description: none known Does patient have access to weapons?: No Criminal Charges Pending?: No Does patient have a court date: No Prior Inpatient Therapy:  Prior Inpatient Therapy: No Prior Outpatient Therapy: Prior Outpatient Therapy: No Does patient have an ACCT team?: No Does patient have Intensive In-House Services?  : No Does patient have [facility] services? : No Does patient have P4CC services?: No  Past Medical History:  Past Medical History:  Diagnosis Date  . Hypertension   . Hypo-osmolality and hyponatremia     Past Surgical History:  Procedure Laterality Date  . HUMERUS FRACTURE SURGERY Left    Family History: History reviewed. No pertinent family history. Family Psychiatric  History: none Social History:  Social History   Substance and Sexual Activity  Alcohol Use No     Social History   Substance and Sexual Activity  Drug Use No    Social History   Socioeconomic History  . Marital status: Married    Spouse name: Not on file  . Number of children: Not on file  . Years of education: Not on file  . Highest education level: Not on file  Occupational History  . Not on file  Social Needs  . Financial resource strain: Not on file  . Food insecurity:    Worry: Not on file    Inability: Not on file  . Transportation needs:    Medical: Not on file    Non-medical: Not on file  Tobacco Use  . Smoking status: Former Smoker    Packs/day: 0.30    Years: 1.00    Pack years: 0.30    Types: Cigarettes    Last attempt to quit: 01/13/1949    Years since quitting: 69.0  . Smokeless tobacco: Never Used  Substance and Sexual Activity  .  Alcohol use: No  . Drug use: No  . Sexual activity: Not on file  Lifestyle  . Physical activity:    Days per week: Not on file    Minutes per session: Not on file  . Stress: Not on file  Relationships  . Social connections:    Talks on phone: Not on file    Gets together: Not on file    Attends religious service: Not on file    Active member of club or organization: Not on file    Attends meetings of clubs or organizations: Not on file    Relationship status: Not on file   Other Topics Concern  . Not on file  Social History Narrative  . Not on file   Additional Social History: N/A    Allergies:  No Known Allergies  Labs:  Results for orders placed or performed during the hospital encounter of 01/28/18 (from the past 48 hour(s))  Urinalysis, Routine w reflex microscopic     Status: Abnormal   Collection Time: 01/28/18 12:31 PM  Result Value Ref Range   Color, Urine YELLOW YELLOW   APPearance CLEAR CLEAR   Specific Gravity, Urine 1.013 1.005 - 1.030   pH 7.0 5.0 - 8.0   Glucose, UA NEGATIVE NEGATIVE mg/dL   Hgb urine dipstick NEGATIVE NEGATIVE   Bilirubin Urine NEGATIVE NEGATIVE   Ketones, ur 5 (A) NEGATIVE mg/dL   Protein, ur NEGATIVE NEGATIVE mg/dL   Nitrite NEGATIVE NEGATIVE   Leukocytes, UA NEGATIVE NEGATIVE    Comment: Performed at [facility], [place] [address] [place], [place] [address]  Comprehensive metabolic panel     Status: Abnormal   Collection Time: 01/28/18 12:50 PM  Result Value Ref Range   Sodium 131 (L) 135 - 145 mmol/L   Potassium 5.4 (H) 3.5 - 5.1 mmol/L   Chloride 91 (L) 101 - 111 mmol/L   CO2 32 22 - 32 mmol/L   Glucose, Bld 103 (H) 65 - 99 mg/dL   BUN 20 6 - 20 mg/dL   Creatinine, Ser 0.54 0.44 - 1.00 mg/dL   Calcium 9.5 8.9 - 10.3 mg/dL   Total Protein 6.7 6.5 - 8.1 g/dL   Albumin 3.4 (L) 3.5 - 5.0 g/dL   AST 28 15 - 41 U/L   ALT 16 14 - 54 U/L   Alkaline Phosphatase 133 (H) 38 - 126 U/L   Total Bilirubin 0.7 0.3 - 1.2 mg/dL   GFR calc non Af Amer >60 >60 mL/min   GFR calc Af Amer >60 >60 mL/min    Comment: (NOTE) The eGFR has been calculated using the CKD EPI equation. This calculation has not been validated in all clinical situations. eGFR's persistently <60 mL/min signify possible Chronic Kidney Disease.    Anion gap 8 5 - 15    Comment: Performed at [facility], [place] [address] [place], [place] [address]  CBC     Status: None   Collection Time: 01/28/18 12:50 PM   Result Value Ref Range   WBC 6.4 4.0 - 10.5 K/uL   RBC 4.70 3.87 - 5.11 MIL/uL   Hemoglobin 14.2 12.0 - 15.0 g/dL   HCT 42.5 36.0 - 46.0 %   MCV 90.4 78.0 - 100.0 fL   MCH 30.2 26.0 - 34.0 pg   MCHC 33.4 30.0 - 36.0 g/dL   RDW 14.9 11.5 - 15.5 %   Platelets 357 150 - 400 K/uL    Comment: Performed at [facility]  [place] [address], [place], [place] [address]  CBG monitoring, ED     Status: Abnormal   Collection Time: 01/28/18 12:50 PM  Result Value Ref Range   Glucose-Capillary 104 (H) 65 - 99 mg/dL  Protime-INR - (order if patient is taking Coumadin / Warfarin)     Status: None   Collection Time: 01/28/18 12:50 PM  Result Value Ref Range   Prothrombin Time 12.1 11.4 - 15.2 seconds   INR 0.90     Comment: Performed at [facility], [place] [address] [place], [place] [address]    Current Facility-Administered Medications  Medication Dose Route Frequency Provider Last Rate Last Dose  . acetaminophen (TYLENOL) tablet 250 mg  250 mg Oral Q4H PRN Couture, Cortni S, PA-C      . clonazePAM (KLONOPIN) tablet 0.5 mg  0.5 mg Oral QHS Patrecia Pour, NP   0.5 mg at 01/29/18 0101  . divalproex (DEPAKOTE SPRINKLE) capsule 125 mg  125 mg Oral Q12H Lord, Jamison Y, NP      . feeding supplement (ENSURE ENLIVE) (ENSURE ENLIVE) liquid 237 mL  237 mL Oral BID Couture, Cortni S, PA-C   Stopped at 01/29/18 1028  . losartan (COZAAR) tablet 25 mg  25 mg Oral Daily Ward, Kristen N, DO   25 mg at 01/29/18 0457  . magic mouthwash  5 mL Oral BID PRN Couture, Cortni S, PA-C      . MUSCLE RUB CREA 1 application  1 application Topical BID Couture, Cortni S, PA-C      . QUEtiapine (SEROQUEL) tablet 25 mg  25 mg Oral BID PRN Patrecia Pour, NP       Current Outpatient Medications  Medication Sig Dispense Refill  . acetaminophen (TYLENOL) 500 MG tablet Take 250 mg by mouth every 4 (four) hours as needed for moderate pain. Reported on 11/13/2015    . Biotin 10000 MCG TABS Take  1 tablet by mouth every 3 (three) days.    . clonazePAM (KLONOPIN) 1 MG tablet Take 0.5 mg by mouth at bedtime.    . ENSURE (ENSURE) Take 237 mLs by mouth 2 (two) times daily.    Marland Kitchen losartan (COZAAR) 25 MG tablet Take 25 mg by mouth daily.    . Lutein 20 MG CAPS Take 1 capsule by mouth as directed. Lydia [place], [place], Sat    . Lutein 40 MG CAPS Take 1 capsule by mouth 3 (three) times a week. MWF    . magic mouthwash SOLN Take 5 mLs by mouth 2 (two) times daily as needed for mouth pain. Every 12 hours as needed for burning of mouth/swish and spit    . Multiple Vitamins-Minerals (CENTRUM SILVER 50+WOMEN) TABS Take 1 tablet by mouth as directed. MWF    . trolamine salicylate (ASPERCREME) 10 % cream Apply 1 application topically 2 (two) times daily.    Marland Kitchen zinc oxide (BALMEX) 11.3 % CREA cream Apply 1 application topically daily as needed.     . divalproex (DEPAKOTE SPRINKLE) 125 MG capsule Take 1 capsule (125 mg total) by mouth every 12 (twelve) hours. 60 capsule 0  . QUEtiapine (SEROQUEL) 25 MG tablet Take 1 tablet (25 mg total) by mouth 2 (two) times daily as needed (agitation). 60 tablet 0    Musculoskeletal: Strength & Muscle Tone: within normal limits Gait & Station: normal Patient leans: N/A  Psychiatric Specialty Exam: Physical Exam  Constitutional: She is oriented to person, place, and time. She appears well-developed  and well-nourished.  HENT:  Head: Normocephalic and atraumatic.  Neck: Normal range of motion.  Respiratory: Effort normal.  Musculoskeletal: Normal range of motion.  Neurological: She is alert and oriented to person, place, and time.  Psychiatric: Her speech is normal and behavior is normal. Judgment and thought content normal. Her mood appears anxious. Cognition and memory are impaired.    Review of Systems  Psychiatric/Behavioral: Positive for memory loss. The patient is nervous/anxious.   All other systems reviewed and are negative.   Blood pressure 102/68, pulse  73, temperature 98.7 F (37.1 C), temperature source Oral, resp. rate 18, SpO2 95 %.There is no height or weight on file to calculate BMI.  General Appearance: Casual  Eye Contact:  Good  Speech:  Normal Rate  Volume:  Normal  Mood:  Anxious  Affect:  Congruent  Thought Process:  Coherent and Descriptions of Associations: Intact  Orientation:  Full (Time, Place, and Person)  Thought Content:  WDL and Logical  Suicidal Thoughts:  No  Homicidal Thoughts:  No  Memory:  Immediate;   Fair Recent;   Fair Remote;   Fair  Judgement:  Fair  Insight:  Fair  Psychomotor Activity:  Normal  Concentration:  Concentration: Fair and Attention Span: Fair  Recall:  [employer] of Knowledge:  Fair  Language:  Good  Akathisia:  No  Handed:  Right  AIMS (if indicated):   N/A  Assets:  Housing Leisure Time Physical Health Resilience Social Support  ADL's:  Impaired  Cognition:  Impaired,  Mild  Sleep:   N/A     Treatment Plan Summary: Paranoia: -Started Seroquel 25 mg BID PRN for paranoia. -Started Depakote 125 mg BID for mood stabilization  Continued her medical medications  Disposition: No evidence of imminent risk to self or others at present.    Waylan Boga, NP 01/29/2018 10:34 AM   Patient seen face-to-face for psychiatric evaluation, chart reviewed and case discussed with the physician extender and developed treatment plan. Reviewed the information documented and agree with the treatment plan.  Buford Dresser, DO 01/30/18 1:02 AM

## 2018-01-29 NOTE — ED Notes (Signed)
CT called to inquire about portable CXR for patient. Per staff, it will be done shortly.

## 2018-01-29 NOTE — ED Provider Notes (Signed)
2:30 AM  Patient has had intermittent episodes of hypoxia.  Nursing staff concern for possible aspiration as she is having a difficult time swallowing which may be her baseline.  Will obtain chest x-ray.  Blood pressure initially elevated.  Have ordered her home losartan.  Blood pressure has come down appropriately.  4:40 AM  Pt refuses chest x-ray.  She has not had any further documented hypoxia.  She did have one set of hypoxia and hypotension noted that was erroneous.  This has been removed from the chart.  At this time I feel she is medically cleared.   Amil Moseman, Layla Maw, DO 01/29/18 [phone]

## 2018-01-29 NOTE — Progress Notes (Signed)
Patient presents to [facility] from [place]- patient currently psychiatrically cleared. Patient is awaiting speech evaluation- CSW will follow up after evaluation.   Stacy Gardner, [facility] Clinical Social Worker [phone]

## 2018-01-29 NOTE — [facility] Suicide Risk Assessment (Signed)
Suicide Risk Assessment  Discharge Assessment   [facility] Discharge Suicide Risk Assessment   Principal Problem: Paranoia [facility]) Discharge Diagnoses:  Patient Active Problem List   Diagnosis Date Noted  . Paranoia ([facility]) [F22] 01/29/2018    Priority: High  . Dehydration [E86.0] 02/25/2017  . At risk for falling [Z91.81] 09/01/2014  . Carotid bruit [R09.89] 01/14/2012  . Osteoporosis [M81.0] 01/14/2012  . HTN (hypertension) [I10] 09/01/2011  . Scoliosis [M41.9] 09/01/2011  . Hyponatremia [E87.1] 09/01/2011  . Uterine prolapse [N81.4] 09/01/2011    Total Time spent with patient: 45 minutes   Musculoskeletal: Strength & Muscle Tone: within normal limits Gait & Station: normal Patient leans: N/A  Psychiatric Specialty Exam: Physical Exam  Constitutional: She is oriented to person, place, and time. She appears well-developed and well-nourished.  HENT:  Head: Normocephalic.  Neck: Normal range of motion.  Respiratory: Effort normal.  Musculoskeletal: Normal range of motion.  Neurological: She is alert and oriented to person, place, and time.  Psychiatric: Her speech is normal and behavior is normal. Judgment and thought content normal. Her mood appears anxious. Cognition and memory are impaired.    Review of Systems  Psychiatric/Behavioral: Positive for memory loss. The patient is nervous/anxious.   All other systems reviewed and are negative.   Blood pressure 102/68, pulse 73, temperature 98.7 F (37.1 C), temperature source Oral, resp. rate 18, SpO2 95 %.There is no height or weight on file to calculate BMI.  General Appearance: Casual  Eye Contact:  Good  Speech:  Normal Rate  Volume:  Normal  Mood:  Anxious  Affect:  Congruent  Thought Process:  Coherent and Descriptions of Associations: Intact  Orientation:  Full (Time, Place, and Person)  Thought Content:  WDL and Logical  Suicidal Thoughts:  No  Homicidal Thoughts:  No  Memory:  Immediate;   Fair Recent;    Fair Remote;   Fair  Judgement:  Fair  Insight:  Fair  Psychomotor Activity:  Normal  Concentration:  Concentration: Fair and Attention Span: Fair  Recall:  [employer] of Knowledge:  Fair  Language:  Good  Akathisia:  No  Handed:  Right  AIMS (if indicated):     Assets:  Housing Leisure Time Physical Health Resilience Social Support  ADL's:  Impaired  Cognition:  Impaired,  Mild  Sleep:       Mental Status Per Nursing Assessment::   On Admission:   paranoia  Demographic Factors:  Age 84 or older and Caucasian  Loss Factors: NA  Historical Factors: NA  Risk Reduction Factors:   Sense of responsibility to family, Living with another person, especially a relative, Positive social support and Positive therapeutic relationship  Continued Clinical Symptoms:  Anxiety   Cognitive Features That Contribute To Risk:  None    Suicide Risk:  Minimal: No identifiable suicidal ideation.  Patients presenting with no risk factors but with morbid ruminations; may be classified as minimal risk based on the severity of the depressive symptoms    Plan Of Care/Follow-up recommendations:  Activity:  as tolerated Diet:  heart healthy diet  Raoul Ciano, NP 01/29/2018, 10:43 AM

## 2018-01-29 NOTE — ED Notes (Signed)
Pt getting choked on liquids while sitting up.

## 2018-01-29 NOTE — ED Notes (Signed)
Patient returned from getting CXR done. Pt tolerated well; no signs of distress noted.

## 2018-01-29 NOTE — ED Notes (Signed)
Patient needing assistance in eating her breakfast. Pt able to eat her eggs and oatmeal, but appeared to have difficulty in swallowing her fluids. Pt able to clear throat with coughing. No distress noted at this time.

## 2018-01-29 NOTE — ED Notes (Signed)
Pt placed on monitor.  

## 2018-01-29 NOTE — ED Notes (Signed)
Patient with orders for swallow evaluation due to difficulty with thin fluid intake. Pt currently NPO pending evaluation. Pt yelling and is restless due to requesting snacks. Writer explained multiple times to patient about reasons for not eating at this time. Pt unable to be redirected at this time. No distress noted currently. O2 level at 94% on room air.

## 2018-01-29 NOTE — ED Notes (Signed)
Dr. Freida Busman notified of CXR results; per MD, ok to proceed with discharge at this time.

## 2018-01-29 NOTE — ED Notes (Signed)
Pt refusing x-ray stating "I don't want anymore damn x-rays.  I don't care why they want them."

## 2018-01-29 NOTE — Progress Notes (Signed)
CSW spoke with representative from [facility]- patient is able to return to facility once medically stable. Facility will need speech evaluation sent via the hub. CSW notified patients daughter, Stevan Born [phone], that patient was psychiatrically cleared and would be ready for discharge. Daughter had no questions regarding disposition planning. Please notify daughter once [facility] has picked patient up.   Once ready patient can be transported via [facility]. Please call report to [phone]   Stacy Gardner, [facility] Emergency Room Clinical Social Worker [phone]

## 2018-01-29 NOTE — BH Assessment (Signed)
[facility] Assessment Progress Note  Per Juanetta Beets, DO, this pt does not require psychiatric hospitalization at this time.  Pt is to discharged from [facility].  No follow up behavioral health referrals are required.  Stacy Gardner, LCSW agrees to facilitate return to community.  Pt's nurse, Marcelino Duster, has been notified.  Doylene Canning, MA Triage Specialist [phone]

## 2018-01-29 NOTE — ED Notes (Signed)
Patient able to eat 75% of her meal slowly while being fed by Clinical research associate. Pt tolerated well; no distress noted at this time.  Full linen change and peri care performed.

## 2018-01-29 NOTE — ED Notes (Signed)
[facility] called for ETA until  pick up dispatch states Mrs Caraway is next in line.

## 2018-01-29 NOTE — ED Notes (Signed)
Patient constantly yelling out in the unit "Help me! Help me!". When this writer entered room, patient repeating same phrase and is unable to be redirected at this time. Patient disturbing other patients on the unit. Pt anxious and worried. MD contacted; awaiting orders at this time.

## 2018-01-29 NOTE — ED Notes (Signed)
Pt to Xray for chest imaging.
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
## 2018-01-29 NOTE — ED Notes (Signed)
Speech Therapist in room to perform a swallow evaluation at this time. Pt cooperative. No distress noted.

## 2018-02-04 ENCOUNTER — Encounter (HOSPITAL_COMMUNITY): Payer: Self-pay

## 2018-02-04 ENCOUNTER — Emergency Department (HOSPITAL_COMMUNITY)
Admission: EM | Admit: 2018-02-04 | Discharge: 2018-02-05 | Disposition: A | Discharge status: Home / Self Care | Payer: Medicare Other | Source: Home / Self Care | Attending: Emergency Medicine | Admitting: Emergency Medicine

## 2018-02-04 ENCOUNTER — Emergency Department (HOSPITAL_COMMUNITY): Payer: Medicare Other

## 2018-02-04 ENCOUNTER — Other Ambulatory Visit: Payer: Self-pay

## 2018-02-04 DIAGNOSIS — F22 Delusional disorders: Secondary | ICD-10-CM | POA: Diagnosis not present

## 2018-02-04 DIAGNOSIS — M5489 Other dorsalgia: Secondary | ICD-10-CM | POA: Diagnosis not present

## 2018-02-04 DIAGNOSIS — I10 Essential (primary) hypertension: Secondary | ICD-10-CM | POA: Insufficient documentation

## 2018-02-04 DIAGNOSIS — Z79899 Other long term (current) drug therapy: Secondary | ICD-10-CM

## 2018-02-04 DIAGNOSIS — I169 Hypertensive crisis, unspecified: Secondary | ICD-10-CM | POA: Diagnosis not present

## 2018-02-04 DIAGNOSIS — M545 Low back pain: Secondary | ICD-10-CM | POA: Insufficient documentation

## 2018-02-04 DIAGNOSIS — I493 Ventricular premature depolarization: Secondary | ICD-10-CM | POA: Diagnosis not present

## 2018-02-04 DIAGNOSIS — E871 Hypo-osmolality and hyponatremia: Secondary | ICD-10-CM | POA: Diagnosis not present

## 2018-02-04 DIAGNOSIS — F0390 Unspecified dementia without behavioral disturbance: Secondary | ICD-10-CM

## 2018-02-04 DIAGNOSIS — N390 Urinary tract infection, site not specified: Secondary | ICD-10-CM | POA: Diagnosis not present

## 2018-02-04 DIAGNOSIS — Z515 Encounter for palliative care: Secondary | ICD-10-CM | POA: Diagnosis not present

## 2018-02-04 DIAGNOSIS — J690 Pneumonitis due to inhalation of food and vomit: Secondary | ICD-10-CM | POA: Diagnosis not present

## 2018-02-04 DIAGNOSIS — R4182 Altered mental status, unspecified: Secondary | ICD-10-CM | POA: Diagnosis not present

## 2018-02-04 DIAGNOSIS — J9602 Acute respiratory failure with hypercapnia: Secondary | ICD-10-CM | POA: Diagnosis not present
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
## 2018-02-04 NOTE — ED Triage Notes (Signed)
Patient arrives by [facility] from [facility] with complaints of non-traumatic back pain.
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
## 2018-02-05 ENCOUNTER — Inpatient Hospital Stay (HOSPITAL_COMMUNITY)
Admission: EM | Admit: 2018-02-05 | Discharge: 2018-02-20 | DRG: 177 | Disposition: E | Payer: Medicare Other | Attending: Internal Medicine | Admitting: Internal Medicine

## 2018-02-05 ENCOUNTER — Encounter (HOSPITAL_COMMUNITY): Payer: Self-pay | Admitting: *Deleted

## 2018-02-05 ENCOUNTER — Emergency Department (HOSPITAL_COMMUNITY): Payer: Medicare Other

## 2018-02-05 DIAGNOSIS — Z87891 Personal history of nicotine dependence: Secondary | ICD-10-CM | POA: Diagnosis not present

## 2018-02-05 DIAGNOSIS — L89152 Pressure ulcer of sacral region, stage 2: Secondary | ICD-10-CM | POA: Diagnosis present

## 2018-02-05 DIAGNOSIS — Z515 Encounter for palliative care: Secondary | ICD-10-CM | POA: Diagnosis not present

## 2018-02-05 DIAGNOSIS — N3000 Acute cystitis without hematuria: Secondary | ICD-10-CM | POA: Diagnosis not present

## 2018-02-05 DIAGNOSIS — N390 Urinary tract infection, site not specified: Secondary | ICD-10-CM | POA: Diagnosis present

## 2018-02-05 DIAGNOSIS — I469 Cardiac arrest, cause unspecified: Secondary | ICD-10-CM | POA: Diagnosis not present

## 2018-02-05 DIAGNOSIS — J690 Pneumonitis due to inhalation of food and vomit: Secondary | ICD-10-CM | POA: Diagnosis present

## 2018-02-05 DIAGNOSIS — F22 Delusional disorders: Secondary | ICD-10-CM | POA: Diagnosis not present

## 2018-02-05 DIAGNOSIS — Z7401 Bed confinement status: Secondary | ICD-10-CM | POA: Diagnosis not present

## 2018-02-05 DIAGNOSIS — E871 Hypo-osmolality and hyponatremia: Secondary | ICD-10-CM | POA: Diagnosis present

## 2018-02-05 DIAGNOSIS — R0902 Hypoxemia: Secondary | ICD-10-CM

## 2018-02-05 DIAGNOSIS — R451 Restlessness and agitation: Secondary | ICD-10-CM | POA: Diagnosis present

## 2018-02-05 DIAGNOSIS — J9811 Atelectasis: Secondary | ICD-10-CM | POA: Diagnosis not present

## 2018-02-05 DIAGNOSIS — Z79899 Other long term (current) drug therapy: Secondary | ICD-10-CM

## 2018-02-05 DIAGNOSIS — I10 Essential (primary) hypertension: Secondary | ICD-10-CM | POA: Diagnosis present

## 2018-02-05 DIAGNOSIS — R456 Violent behavior: Secondary | ICD-10-CM | POA: Diagnosis not present

## 2018-02-05 DIAGNOSIS — Z66 Do not resuscitate: Secondary | ICD-10-CM | POA: Diagnosis not present

## 2018-02-05 DIAGNOSIS — B9620 Unspecified Escherichia coli [E. coli] as the cause of diseases classified elsewhere: Secondary | ICD-10-CM | POA: Diagnosis present

## 2018-02-05 DIAGNOSIS — I169 Hypertensive crisis, unspecified: Secondary | ICD-10-CM | POA: Diagnosis present

## 2018-02-05 DIAGNOSIS — M2550 Pain in unspecified joint: Secondary | ICD-10-CM | POA: Diagnosis not present

## 2018-02-05 DIAGNOSIS — J9602 Acute respiratory failure with hypercapnia: Secondary | ICD-10-CM | POA: Diagnosis not present

## 2018-02-05 DIAGNOSIS — R4182 Altered mental status, unspecified: Secondary | ICD-10-CM | POA: Diagnosis not present

## 2018-02-05 DIAGNOSIS — I493 Ventricular premature depolarization: Secondary | ICD-10-CM | POA: Diagnosis not present

## 2018-02-05 LAB — LACTIC ACID, PLASMA: LACTIC ACID, VENOUS: 1.2 mmol/L (ref 0.5–1.9)

## 2018-02-05 LAB — URINALYSIS, ROUTINE W REFLEX MICROSCOPIC
BILIRUBIN URINE: NEGATIVE
Glucose, UA: NEGATIVE mg/dL
Ketones, ur: NEGATIVE mg/dL
NITRITE: POSITIVE — AB
PH: 9 — AB (ref 5.0–8.0)
Protein, ur: NEGATIVE mg/dL
SPECIFIC GRAVITY, URINE: 1.005 (ref 1.005–1.030)

## 2018-02-05 LAB — COMPREHENSIVE METABOLIC PANEL
ALT: 19 U/L (ref 14–54)
ANION GAP: 6 (ref 5–15)
AST: 24 U/L (ref 15–41)
Albumin: 3.3 g/dL — ABNORMAL LOW (ref 3.5–5.0)
Alkaline Phosphatase: 120 U/L (ref 38–126)
BILIRUBIN TOTAL: 0.2 mg/dL — AB (ref 0.3–1.2)
BUN: 21 mg/dL — AB (ref 6–20)
CO2: 32 mmol/L (ref 22–32)
Calcium: 8.8 mg/dL — ABNORMAL LOW (ref 8.9–10.3)
Chloride: 88 mmol/L — ABNORMAL LOW (ref 101–111)
Creatinine, Ser: 0.5 mg/dL (ref 0.44–1.00)
GFR calc Af Amer: >60 mL/min (ref >60)
Glucose, Bld: 98 mg/dL (ref 65–99)
Potassium: 4.3 mmol/L (ref 3.5–5.1)
Sodium: 126 mmol/L — ABNORMAL LOW (ref 135–145)
TOTAL PROTEIN: 6.5 g/dL (ref 6.5–8.1)

## 2018-02-05 LAB — CBC WITH DIFFERENTIAL/PLATELET
Basophils Absolute: 0 10*3/uL (ref 0.0–0.1)
Basophils Relative: 0 %
EOS PCT: 0 %
Eosinophils Absolute: 0 10*3/uL (ref 0.0–0.7)
HCT: 41.2 % (ref 36.0–46.0)
HEMOGLOBIN: 14 g/dL (ref 12.0–15.0)
LYMPHS ABS: 0.8 10*3/uL (ref 0.7–4.0)
LYMPHS PCT: 11 %
MCH: 30.2 pg (ref 26.0–34.0)
MCHC: 34 g/dL (ref 30.0–36.0)
MCV: 88.8 fL (ref 78.0–100.0)
Monocytes Absolute: 0.6 10*3/uL (ref 0.1–1.0)
Monocytes Relative: 9 %
NEUTROS PCT: 80 %
Neutro Abs: 5.6 10*3/uL (ref 1.7–7.7)
Platelets: 411 10*3/uL — ABNORMAL HIGH (ref 150–400)
RBC: 4.64 MIL/uL (ref 3.87–5.11)
RDW: 14.6 % (ref 11.5–15.5)
WBC: 7.1 10*3/uL (ref 4.0–10.5)

## 2018-02-05 LAB — RAPID URINE DRUG SCREEN, HOSP PERFORMED
AMPHETAMINES: NOT DETECTED
Barbiturates: NOT DETECTED
Benzodiazepines: NOT DETECTED
Cocaine: NOT DETECTED
OPIATES: NOT DETECTED
Tetrahydrocannabinol: NOT DETECTED

## 2018-02-05 LAB — SODIUM, URINE, RANDOM: SODIUM UR: 60 mmol/L

## 2018-02-05 LAB — OSMOLALITY, URINE: OSMOLALITY UR: 257 mosm/kg — AB (ref 300–900)

## 2018-02-05 LAB — MAGNESIUM: Magnesium: 1.9 mg/dL (ref 1.7–2.4)

## 2018-02-05 LAB — URIC ACID: URIC ACID, SERUM: 3.2 mg/dL (ref 2.3–6.6)

## 2018-02-05 LAB — TROPONIN I: Troponin I: 0.03 ng/mL (ref <0.03)

## 2018-02-05 MED ORDER — SENNOSIDES-DOCUSATE SODIUM 8.6-50 MG PO TABS: 1.0000 | ORAL_TABLET | Freq: Every evening | ORAL | Status: DC | PRN

## 2018-02-05 MED ORDER — ONDANSETRON HCL 4 MG/2ML IJ SOLN: 4.0000 mg | Freq: Four times a day (QID) | INTRAMUSCULAR | Status: DC | PRN

## 2018-02-05 MED ORDER — LACTATED RINGERS IV BOLUS
1000.0000 mL | Freq: Once | INTRAVENOUS | Status: AC
  Administered 2018-02-05: 1000 mL via INTRAVENOUS

## 2018-02-05 MED ORDER — MUSCLE RUB 10-15 % EX CREA
1.0000 "application " | TOPICAL_CREAM | Freq: Two times a day (BID) | CUTANEOUS | Status: DC
  Administered 2018-02-06 (×2): 1 via TOPICAL
  Filled 2018-02-05: qty 85

## 2018-02-05 MED ORDER — SODIUM CHLORIDE 0.9% FLUSH
3.0000 mL | Freq: Two times a day (BID) | INTRAVENOUS | Status: DC
  Administered 2018-02-05 – 2018-02-06 (×3): 3 mL via INTRAVENOUS

## 2018-02-05 MED ORDER — TRAZODONE HCL 50 MG PO TABS
25.0000 mg | ORAL_TABLET | Freq: Every evening | ORAL | Status: DC | PRN
  Administered 2018-02-05 – 2018-02-06 (×2): 25 mg via ORAL
  Filled 2018-02-05 (×2): qty 1

## 2018-02-05 MED ORDER — ENOXAPARIN SODIUM 30 MG/0.3ML ~~LOC~~ SOLN: 30.0000 mg | Freq: Every day | SUBCUTANEOUS | Status: DC

## 2018-02-05 MED ORDER — SODIUM CHLORIDE 0.9 % IV SOLN
1.0000 g | INTRAVENOUS | Status: DC
  Administered 2018-02-06: 1 g via INTRAVENOUS
  Filled 2018-02-05 (×2): qty 10

## 2018-02-05 MED ORDER — CLONAZEPAM 0.5 MG PO TABS
0.5000 mg | ORAL_TABLET | Freq: Every day | ORAL | Status: DC
  Administered 2018-02-05 – 2018-02-06 (×2): 0.5 mg via ORAL
  Filled 2018-02-05 (×2): qty 1

## 2018-02-05 MED ORDER — SODIUM CHLORIDE 0.9% FLUSH: 3.0000 mL | INTRAVENOUS | Status: DC | PRN

## 2018-02-05 MED ORDER — ZINC OXIDE 11.3 % EX CREA
1.0000 "application " | TOPICAL_CREAM | Freq: Every day | CUTANEOUS | Status: DC
  Administered 2018-02-06: 1 via TOPICAL
  Filled 2018-02-05: qty 56

## 2018-02-05 MED ORDER — ADULT MULTIVITAMIN W/MINERALS CH
1.0000 | ORAL_TABLET | Freq: Every day | ORAL | Status: DC
  Administered 2018-02-07: 1 via ORAL
  Filled 2018-02-05 (×3): qty 1

## 2018-02-05 MED ORDER — SODIUM CHLORIDE 0.9 % IV SOLN: 250.0000 mL | INTRAVENOUS | Status: DC | PRN

## 2018-02-05 MED ORDER — DIVALPROEX SODIUM 125 MG PO CSDR
125.0000 mg | DELAYED_RELEASE_CAPSULE | Freq: Two times a day (BID) | ORAL | Status: DC
  Administered 2018-02-05 – 2018-02-07 (×4): 125 mg via ORAL
  Filled 2018-02-05 (×4): qty 1

## 2018-02-05 MED ORDER — ACETAMINOPHEN 325 MG PO TABS: 650.0000 mg | ORAL_TABLET | Freq: Four times a day (QID) | ORAL | Status: DC | PRN

## 2018-02-05 MED ORDER — SODIUM CHLORIDE 0.9 % IV SOLN
1.0000 g | Freq: Once | INTRAVENOUS | Status: AC
  Administered 2018-02-05: 1 g via INTRAVENOUS
  Filled 2018-02-05: qty 10

## 2018-02-05 MED ORDER — SODIUM CHLORIDE 0.9 % IV SOLN
INTRAVENOUS | Status: DC
  Administered 2018-02-05: 23:00:00 via INTRAVENOUS

## 2018-02-05 MED ORDER — ONDANSETRON HCL 4 MG PO TABS: 4.0000 mg | ORAL_TABLET | Freq: Four times a day (QID) | ORAL | Status: DC | PRN

## 2018-02-05 MED ORDER — LOSARTAN POTASSIUM 25 MG PO TABS
25.0000 mg | ORAL_TABLET | Freq: Every day | ORAL | Status: DC
  Administered 2018-02-06 – 2018-02-07 (×2): 25 mg via ORAL
  Filled 2018-02-05 (×3): qty 1

## 2018-02-05 MED ORDER — ALBUTEROL SULFATE (2.5 MG/3ML) 0.083% IN NEBU
2.5000 mg | INHALATION_SOLUTION | RESPIRATORY_TRACT | Status: DC | PRN
  Filled 2018-02-05: qty 3

## 2018-02-05 MED ORDER — SODIUM CHLORIDE 0.9 % IV BOLUS
1000.0000 mL | Freq: Once | INTRAVENOUS | Status: AC
  Administered 2018-02-05: 1000 mL via INTRAVENOUS

## 2018-02-05 MED ORDER — QUETIAPINE FUMARATE 25 MG PO TABS
25.0000 mg | ORAL_TABLET | Freq: Two times a day (BID) | ORAL | Status: DC | PRN
  Administered 2018-02-07: 25 mg via ORAL
  Filled 2018-02-05: qty 1

## 2018-02-05 MED ORDER — ACETAMINOPHEN 650 MG RE SUPP: 650.0000 mg | Freq: Four times a day (QID) | RECTAL | Status: DC | PRN

## 2018-02-05 MED ORDER — HYDRALAZINE HCL 20 MG/ML IJ SOLN
10.0000 mg | Freq: Four times a day (QID) | INTRAMUSCULAR | Status: DC | PRN
  Administered 2018-02-05: 10 mg via INTRAVENOUS
  Filled 2018-02-05: qty 1
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
## 2018-02-05 NOTE — ED Notes (Signed)
Date and time results received: 01/31/2018 4:10 PM (use smartphrase ".now" to insert current time)  Test: troponin Critical Value: 0.03  Name of Provider Notified: [facility]  Orders Received? Or Actions Taken?: MD made aware
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
## 2018-02-05 NOTE — ED Notes (Signed)
Unable to assess vital signs. Pt stated "Please leave me alone. I don't want any vital signs, I just want to be left alone." Leeroy Bock, RN notified.
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
## 2018-02-05 NOTE — ED Notes (Signed)
Bed: [facility] Expected date:  Expected time:  Means of arrival:  Comments: 

## 2018-02-05 NOTE — ED Triage Notes (Signed)
Transported by Alyssa Grove from [place] [facility] was called out for dizziness, upon EMS arrival patient denied any dizziness or other symptoms. Patient simply just stated that she does not want to go back to [facility] facility. Pt refused all treatment from EMS.
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
## 2018-02-05 NOTE — ED Notes (Signed)
[facility] contacted for transport 

## 2018-02-05 NOTE — ED Notes (Signed)
ED TO INPATIENT HANDOFF REPORT  Name/Age/Gender Gina Zavala 84 y.o. female  Code Status    Code Status Orders  (From admission, onward)        Start     Ordered   01/23/2018 1639  Full code  Continuous     02/10/2018 1638    Code Status History    Date Active Date Inactive Code Status Order ID Comments User Context   02/25/2017 2033 02/26/2017 1949 Full Code [id number]  Theodis Blaze, MD Inpatient      Home/SNF/Other Nursing Home  Chief Complaint Doesn't want to be at the facility  Level of Care/Admitting Diagnosis ED Disposition    ED Disposition Condition [place]: [facility] [[id number]]  Level of Care: Med-Surg [16]  Diagnosis: UTI (urinary tract infection) [384536]  Admitting Physician: Alma Friendly [[id number]]  Attending Physician: Alma Friendly [[id number]]  Estimated length of stay: past midnight tomorrow  Certification:: I certify this patient will need inpatient services for at least 2 midnights  PT Class (Do Not Modify): Inpatient [101]  PT Acc Code (Do Not Modify): Private [1]       Medical History Past Medical History:  Diagnosis Date  . Hypertension   . Hypo-osmolality and hyponatremia     Allergies No Known Allergies  IV Location/Drains/Wounds Patient Lines/Drains/Airways Status   Active Line/Drains/Airways    Name:   Placement date:   Placement time:   Site:   Days:   Peripheral IV 01/21/2018 Left Antecubital   02/02/2018    1604    Antecubital   less than 1          Labs/Imaging Results for orders placed or performed during the hospital encounter of 01/29/2018 (from the past 48 hour(s))  CBC with Differential     Status: Abnormal   Collection Time: 01/28/2018  3:26 PM  Result Value Ref Range   WBC 7.1 4.0 - 10.5 K/uL   RBC 4.64 3.87 - 5.11 MIL/uL   Hemoglobin 14.0 12.0 - 15.0 g/dL   HCT 41.2 36.0 - 46.0 %   MCV 88.8 78.0 - 100.0 fL   MCH 30.2 26.0 - 34.0 pg   MCHC 34.0 30.0 - 36.0 g/dL    RDW 14.6 11.5 - 15.5 %   Platelets 411 (H) 150 - 400 K/uL   Neutrophils Relative % 80 %   Neutro Abs 5.6 1.7 - 7.7 K/uL   Lymphocytes Relative 11 %   Lymphs Abs 0.8 0.7 - 4.0 K/uL   Monocytes Relative 9 %   Monocytes Absolute 0.6 0.1 - 1.0 K/uL   Eosinophils Relative 0 %   Eosinophils Absolute 0.0 0.0 - 0.7 K/uL   Basophils Relative 0 %   Basophils Absolute 0.0 0.0 - 0.1 K/uL    Comment: Performed at [facility], [place] [address], [place],  [address]  Comprehensive metabolic panel     Status: Abnormal   Collection Time: 02/08/2018  3:26 PM  Result Value Ref Range   Sodium 126 (L) 135 - 145 mmol/L   Potassium 4.3 3.5 - 5.1 mmol/L   Chloride 88 (L) 101 - 111 mmol/L   CO2 32 22 - 32 mmol/L   Glucose, Bld 98 65 - 99 mg/dL   BUN 21 (H) 6 - 20 mg/dL   Creatinine, Ser 0.50 0.44 - 1.00 mg/dL   Calcium 8.8 (L) 8.9 - 10.3 mg/dL   Total Protein 6.5 6.5 - 8.1 g/dL  Albumin 3.3 (L) 3.5 - 5.0 g/dL   AST 24 15 - 41 U/L   ALT 19 14 - 54 U/L   Alkaline Phosphatase 120 38 - 126 U/L   Total Bilirubin 0.2 (L) 0.3 - 1.2 mg/dL   GFR calc non Af Amer >60 >60 mL/min   GFR calc Af Amer >60 >60 mL/min    Comment: (NOTE) The eGFR has been calculated using the CKD EPI equation. This calculation has not been validated in all clinical situations. eGFR's persistently <60 mL/min signify possible Chronic Kidney Disease.    Anion gap 6 5 - 15    Comment: Performed at [facility], [place] [address] [place], [place] [address]  Lactic acid, plasma     Status: None   Collection Time: 02/10/2018  3:26 PM  Result Value Ref Range   Lactic Acid, Venous 1.2 0.5 - 1.9 mmol/L    Comment: Performed at [facility], [place] [address], [place], [place] [address]  Troponin I     Status: Abnormal   Collection Time: 01/23/2018  3:26 PM  Result Value Ref Range   Troponin I 0.03 (HH) <0.03 ng/mL    Comment: CRITICAL RESULT CALLED TO, READ BACK BY AND VERIFIED  WITH: Durwin Nora [id number] @ 11 BY J SCOTTON Performed at [place] [address], [place], [place] [address]   Urinalysis, Routine w reflex microscopic     Status: Abnormal   Collection Time: 01/21/2018  3:26 PM  Result Value Ref Range   Color, Urine YELLOW YELLOW   APPearance CLOUDY (A) CLEAR   Specific Gravity, Urine 1.005 1.005 - 1.030   pH 9.0 (H) 5.0 - 8.0   Glucose, UA NEGATIVE NEGATIVE mg/dL   Hgb urine dipstick MODERATE (A) NEGATIVE   Bilirubin Urine NEGATIVE NEGATIVE   Ketones, ur NEGATIVE NEGATIVE mg/dL   Protein, ur NEGATIVE NEGATIVE mg/dL   Nitrite POSITIVE (A) NEGATIVE   Leukocytes, UA LARGE (A) NEGATIVE   RBC / HPF 6-10 0 - 5 RBC/hpf   WBC, UA 21-50 0 - 5 WBC/hpf   Bacteria, UA MANY (A) NONE SEEN   Squamous Epithelial / LPF 0-5 0 - 5    Comment: Performed at [facility], [place] [address] [place], [place] [address]  Magnesium     Status: None   Collection Time: 02/09/2018  3:26 PM  Result Value Ref Range   Magnesium 1.9 1.7 - 2.4 mg/dL    Comment: Performed at [facility], [place] [address] [place],  [address]  Rapid urine drug screen (hospital performed)     Status: None   Collection Time: 02/08/2018  3:26 PM  Result Value Ref Range   Opiates NONE DETECTED NONE DETECTED   Cocaine NONE DETECTED NONE DETECTED   Benzodiazepines NONE DETECTED NONE DETECTED   Amphetamines NONE DETECTED NONE DETECTED   Tetrahydrocannabinol NONE DETECTED NONE DETECTED   Barbiturates NONE DETECTED NONE DETECTED    Comment: (NOTE) DRUG SCREEN FOR MEDICAL PURPOSES ONLY.  IF CONFIRMATION IS NEEDED FOR ANY PURPOSE, NOTIFY LAB WITHIN 5 DAYS. LOWEST DETECTABLE LIMITS FOR URINE DRUG SCREEN Drug Class                     Cutoff (ng/mL) Amphetamine and metabolites    1000 Barbiturate and metabolites    200 Benzodiazepine                 122 Tricyclics and metabolites  300 Opiates and metabolites        300 Cocaine and  metabolites        300 THC                            50 Performed at [facility] [place] [address], [place],  [address]    Dg Chest 2 View  Result Date: 02/16/2018 CLINICAL DATA:  Altered mental status EXAM: CHEST - 2 VIEW COMPARISON:  01/29/2018 FINDINGS: Cardiac shadow is stable. Aortic calcifications are again seen. Scoliosis of the thoracolumbar spine is again noted. Focal eventration of the left hemidiaphragm is noted posteriorly stable from the prior exam. No acute infiltrate is seen. No sizable effusion is noted. Chronic changes about the left shoulder joint are noted. IMPRESSION: No acute abnormality noted. Electronically Signed   By: Inez Catalina M.D.   On: 02/16/2018 16:17    Pending Labs Unresulted Labs (From admission, onward)   Start     Ordered   02/12/2018 1759  Uric acid  STAT,   R     02/15/2018 1758   01/29/2018 1759  Culture, blood (routine x 2)  BLOOD CULTURE X 2,   R     02/19/2018 1759   02/06/2018 1756  Sodium, urine, random  STAT,   R     01/27/2018 1758   01/30/2018 1756  Osmolality  STAT,   R     02/08/2018 1758   02/10/2018 1755  Urine Culture  STAT,   R     02/13/2018 1758   02/11/2018 1755  Osmolality, urine  STAT,   R     02/04/2018 1758   Signed and Held  Comprehensive metabolic panel  Tomorrow morning,   R     Signed and Held   Signed and Held  CBC  Tomorrow morning,   R     Signed and Held      Vitals/Pain Today's Vitals   02/12/2018 1330 02/19/2018 1638 02/11/2018 1858 02/11/2018 1908  BP: (!) 164/109 (!) 197/68 (!) 211/61 (!) 211/61  Pulse:  83 (!) 104   Resp:  19 14   Temp:  97.8 F (36.6 C)    TempSrc:  Oral    SpO2:  95% 90%     Isolation Precautions No active isolations  Medications Medications  cefTRIAXone (ROCEPHIN) 1 g in sodium chloride 0.9 % 100 mL IVPB (has no administration in time range)  clonazePAM (KLONOPIN) tablet 0.5 mg (has no administration in time range)  divalproex (DEPAKOTE SPRINKLE) capsule 125 mg (has no  administration in time range)  losartan (COZAAR) tablet 25 mg (has no administration in time range)  CENTRUM SILVER 50+WOMEN TABS 1 tablet (has no administration in time range)  QUEtiapine (SEROQUEL) tablet 25 mg (has no administration in time range)  trolamine salicylate (ASPERCREME) 10 % cream 1 application (has no administration in time range)  zinc oxide (BALMEX) 26.4 % cream 1 application (has no administration in time range)  hydrALAZINE (APRESOLINE) injection 10 mg (10 mg Intravenous Given 02/01/2018 1908)  lactated ringers bolus 1,000 mL (0 mLs Intravenous Stopped 02/04/2018 1732)  sodium chloride 0.9 % bolus 1,000 mL (0 mLs Intravenous Stopped 01/29/2018 1804)  cefTRIAXone (ROCEPHIN) 1 g in sodium chloride 0.9 % 100 mL IVPB (1 g Intravenous New Bag/Given 01/20/2018 1844)    Mobility walks with device
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
## 2018-02-05 NOTE — ED Notes (Signed)
Patient refusing for NT or RN to obtain vital signs at this time.

## 2018-02-06 LAB — COMPREHENSIVE METABOLIC PANEL
ALBUMIN: 2.5 g/dL — AB (ref 3.5–5.0)
ALT: 13 U/L — ABNORMAL LOW (ref 14–54)
AST: 24 U/L (ref 15–41)
Alkaline Phosphatase: 85 U/L (ref 38–126)
Anion gap: 11 (ref 5–15)
BUN: 13 mg/dL (ref 6–20)
CHLORIDE: 96 mmol/L — AB (ref 101–111)
CO2: 23 mmol/L (ref 22–32)
Calcium: 8.2 mg/dL — ABNORMAL LOW (ref 8.9–10.3)
Creatinine, Ser: 0.43 mg/dL — ABNORMAL LOW (ref 0.44–1.00)
GFR calc Af Amer: >60 mL/min (ref >60)
GFR calc non Af Amer: >60 mL/min (ref >60)
GLUCOSE: 125 mg/dL — AB (ref 65–99)
POTASSIUM: 4.1 mmol/L (ref 3.5–5.1)
SODIUM: 130 mmol/L — AB (ref 135–145)
Total Bilirubin: 0.2 mg/dL — ABNORMAL LOW (ref 0.3–1.2)
Total Protein: 5.1 g/dL — ABNORMAL LOW (ref 6.5–8.1)

## 2018-02-06 LAB — GLUCOSE, CAPILLARY: Glucose-Capillary: 110 mg/dL — ABNORMAL HIGH (ref 65–99)

## 2018-02-06 LAB — CBC
HEMATOCRIT: 36.9 % (ref 36.0–46.0)
HEMOGLOBIN: 12 g/dL (ref 12.0–15.0)
MCH: 29.2 pg (ref 26.0–34.0)
MCHC: 32.5 g/dL (ref 30.0–36.0)
MCV: 89.8 fL (ref 78.0–100.0)
Platelets: 286 10*3/uL (ref 150–400)
RBC: 4.11 MIL/uL (ref 3.87–5.11)
RDW: 15 % (ref 11.5–15.5)
WBC: 5.7 10*3/uL (ref 4.0–10.5)

## 2018-02-06 LAB — OSMOLALITY: Osmolality: 278 mOsm/kg (ref 275–295)

## 2018-02-06 MED ORDER — ALUM & MAG HYDROXIDE-SIMETH 200-200-20 MG/5ML PO SUSP
30.0000 mL | Freq: Four times a day (QID) | ORAL | Status: DC | PRN
  Administered 2018-02-06 (×2): 30 mL via ORAL
  Filled 2018-02-06 (×2): qty 30

## 2018-02-06 MED ORDER — SALINE SPRAY 0.65 % NA SOLN
1.0000 | NASAL | Status: DC | PRN
  Filled 2018-02-06: qty 44
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
## 2018-02-06 NOTE — Progress Notes (Signed)
PT Cancellation Note  Patient Details Name: Gina Zavala MRN: [id number] DOB: 02/10/1922   Cancelled Treatment:    Reason Eval/Treat Not Completed: Patient declined, no reason specified. Pt refused to work with PT.  Conversation all over the place with switching topics.  Will check back.   Enzo Montgomery 02/06/2018, 11:05 AM

## 2018-02-07 ENCOUNTER — Inpatient Hospital Stay (HOSPITAL_COMMUNITY): Payer: Medicare Other

## 2018-02-07 LAB — BLOOD GAS, ARTERIAL
ACID-BASE EXCESS: 2.1 mmol/L — AB (ref 0.0–2.0)
BICARBONATE: 35.1 mmol/L — AB (ref 20.0–28.0)
Drawn by: 331471
FIO2: 100
O2 SAT: 98.8 %
PCO2 ART: 107 mmHg — AB (ref 32.0–48.0)
Patient temperature: 98.6
pH, Arterial: 7.144 — CL (ref 7.350–7.450)
pO2, Arterial: 199 mmHg — ABNORMAL HIGH (ref 83.0–108.0)

## 2018-02-07 LAB — BASIC METABOLIC PANEL
Anion gap: 8 (ref 5–15)
BUN: 13 mg/dL (ref 6–20)
CALCIUM: 8.7 mg/dL — AB (ref 8.9–10.3)
CO2: 31 mmol/L (ref 22–32)
CREATININE: 0.31 mg/dL — AB (ref 0.44–1.00)
Chloride: 91 mmol/L — ABNORMAL LOW (ref 101–111)
GFR calc Af Amer: >60 mL/min (ref >60)
Glucose, Bld: 165 mg/dL — ABNORMAL HIGH (ref 65–99)
Potassium: 4.2 mmol/L (ref 3.5–5.1)
SODIUM: 130 mmol/L — AB (ref 135–145)

## 2018-02-07 MED ORDER — SODIUM CHLORIDE 3 % IN NEBU
4.0000 mL | INHALATION_SOLUTION | Freq: Every day | RESPIRATORY_TRACT | Status: DC
  Filled 2018-02-07: qty 4

## 2018-02-07 MED ORDER — MORPHINE SULFATE (PF) 4 MG/ML IV SOLN: 1.0000 mg | INTRAVENOUS | Status: DC | PRN

## 2018-02-07 MED ORDER — FUROSEMIDE 10 MG/ML IJ SOLN
40.0000 mg | Freq: Once | INTRAMUSCULAR | Status: AC
  Administered 2018-02-07: 40 mg via INTRAVENOUS

## 2018-02-07 MED ORDER — FUROSEMIDE 10 MG/ML IJ SOLN
INTRAMUSCULAR | Status: AC
  Filled 2018-02-07: qty 4

## 2018-02-08 LAB — URINE CULTURE: Culture: >=100000 — AB

## 2018-02-10 LAB — CULTURE, BLOOD (ROUTINE X 2)
Culture: NO GROWTH
Culture: NO GROWTH
SPECIAL REQUESTS: ADEQUATE

## 2018-02-20 NOTE — Progress Notes (Signed)
Personal items were retrieved by daughter, Stevan Born and her husband.

## 2018-02-20 NOTE — Death Summary Note (Signed)
Death Summary  Gina Zavala ZOX:[id number] DOB: [birth year] DOA: 2018/02/24  PCP: Josetta Huddle, MD  Admit date: 24-Feb-2018 Date of Death: Feb 26, 2018 Time of Death: 20:26 Notification: Josetta Huddle, MD notified of death   History of present illness:  Gina Ordoyne Duncanis a 84 y.o.femalewith medical history significant forhypertension, hyponatremia,was brought in by EMS for questionable dizziness. On 02/04/2018, patient was seen in the ED for possible back pain, was found to be extremely paranoid,agitated stating that ALF staff members are trying to poison her and physically abusing her. Of note, patient has recently been having some paranoid behavior, family aware. No signs of physical abuse noted. Patient was subsequently sent back to ALF. Patient presented again later the same day, with similar complaints and still very agitated. Patient still paranoid, very worried about medications given to her,denies any new complaints.Patient has been refusing food/medications at ALF. Patient denies any chest pain, shortness of breath, abdominal pain, nausea/vomiting, fever/chills. Metpatient eatingdinner, noted to be coughing after each bite,very talkative.Patient stating that she does not want to go back to ALF. In the ED, UA positive for UTI, noted to be hyponatremic with sodium of 126.Noted hypertensive crisis. Patient was started on IV Rocephin and given IV boluses. Patient admitted for further management.  On 02/26/18, RN noted patient to be unresponsive with saturation about 75% on 4L of O2. Of note, pt had been initially stable earlier this am when examined pt, noted to be yelling for a change in her diet to a regular diet instead of the dysphagia diet recommended by SLP. Pt was also noted to be agitated and PO Seroquel prn (which she normally receives in ALF) was given to patient in the morning. Pt was noted to be desaturating intermittently while sleeping, but was still easily arousable,  pt was subsequently placed on O2. Wound care assessed pt also and she was yelling in pain. Later that evening became unresponsive, code blue was called. Spoke to daughter who is POA about condition and was verbally told to not proceed with code blue, switch to DNR and comfort measures. Daughter arrived 10 mins later and I discussed in details with her, agreed for comfort care. Pt passed away at 20:26.     Final Diagnoses:  Acute hypercarbic respiratory failure likely due to aspiration pneumonia  UTI UC showed >100,000 E.coli BC x2 NGTD  Hyponatremia Resolved  Hypertensive crisis  Agitation/paranoia No known MH/psychdiagnosis, but behavior is unchanged/baseline       The results of significant diagnostics from this hospitalization (including imaging, microbiology, ancillary and laboratory) are listed below for reference.    Significant Diagnostic Studies: Dg Chest 2 View  Result Date: February 24, 2018 CLINICAL DATA:  Altered mental status EXAM: CHEST - 2 VIEW COMPARISON:  01/29/2018 FINDINGS: Cardiac shadow is stable. Aortic calcifications are again seen. Scoliosis of the thoracolumbar spine is again noted. Focal eventration of the left hemidiaphragm is noted posteriorly stable from the prior exam. No acute infiltrate is seen. No sizable effusion is noted. Chronic changes about the left shoulder joint are noted. IMPRESSION: No acute abnormality noted. Electronically Signed   By: Inez Catalina M.D.   On: 02/24/18 16:17   Dg Chest 2 View  Result Date: 01/29/2018 CLINICAL DATA:  Cough, hypoxia EXAM: CHEST - 2 VIEW COMPARISON:  01/21/2018 FINDINGS: No focal consolidation or pneumothorax. Possible small left pleural effusion. No right pleural effusion. Bilateral emphysematous changes. Stable cardiomediastinal silhouette. Thoracic aortic atherosclerosis. S-shaped scoliosis of the thoracolumbar spine. No acute osseous abnormality. IMPRESSION: 1. Possible  small left pleural effusion.  Otherwise no active cardiopulmonary disease. 2. Emphysema. Electronically Signed   By: Kathreen Devoid   On: 01/29/2018 17:14   Dg Chest 2 View  Result Date: 01/21/2018 CLINICAL DATA:  Syncopal episode with altered mental status EXAM: CHEST - 2 VIEW COMPARISON:  02/25/2017, 12/01/2012 FINDINGS: Low lung volumes with atelectasis or scar at the left lung base. Eventration of the left diaphragm as before. No acute airspace disease, pleural effusion or pneumothorax. Stable cardiomediastinal silhouette with dense aortic atherosclerosis. No pneumothorax. Scoliosis and degenerative changes of the spine. IMPRESSION: Scarring or atelectasis at the left base. No acute interval change in radiographic appearance of the chest as compared with 02/25/2017. Electronically Signed   By: Donavan Foil M.D.   On: 01/21/2018 20:49   Ct Head Wo Contrast  Result Date: 01/28/2018 CLINICAL DATA:  Altered mental status and head trauma. EXAM: CT HEAD WITHOUT CONTRAST CT CERVICAL SPINE WITHOUT CONTRAST TECHNIQUE: Multidetector CT imaging of the head and cervical spine was performed following the standard protocol without intravenous contrast. Multiplanar CT image reconstructions of the cervical spine were also generated. COMPARISON:  CT head and cervical spine 08/02/2015 FINDINGS: CT HEAD FINDINGS Brain: No mass lesion, intraparenchymal hemorrhage or extra-axial collection. No evidence of acute cortical infarct. There is an old right cerebellar infarct. There is periventricular hypoattenuation compatible with chronic microvascular disease. Vascular: No hyperdense vessel or unexpected calcification. Skull: Normal visualized skull base, calvarium and extracranial soft tissues. Sinuses/Orbits: Complete opacification of both frontal sinuses. Normal orbits. CT CERVICAL SPINE FINDINGS Alignment: Grade 1 C4-C5 anterolisthesis, unchanged. Skull base and vertebrae: No acute fracture the cervical spine. There is approximately 50% height loss  centrally of the T2 vertebral body which is new compared to 08/02/2015, with no specific features to indicate acuity. Soft tissues and spinal canal: No prevertebral fluid or swelling. No visible canal hematoma. Disc levels: Severe right C4-C5 facet hypertrophy and severe left C3-C4 and C4-C5 facet hypertrophy. No bony spinal canal stenosis. Upper chest: No pneumothorax, pulmonary nodule or pleural effusion. Other: Normal visualized paraspinal cervical soft tissues. IMPRESSION: 1. No acute intracranial abnormality. Old right cerebellar infarct and chronic small vessel disease. 2. Complete opacification of both frontal sinuses, likely chronic sinusitis. 3. No acute fracture of the cervical spine. 4. Severe facet hypertrophy at C3-4 and C4-C5 with resultant grade 1 C4-5 anterolisthesis, unchanged. 5. Compression deformity of the T2 vertebral body with approximately 50% height loss has developed since the cervical spine CT of 08/02/2015. There are no specific fractures to indicate acuity and this is probably an old injury. Electronically Signed   By: Ulyses Jarred M.D.   On: 01/28/2018 14:08   Ct Cervical Spine Wo Contrast  Result Date: 01/28/2018 CLINICAL DATA:  Altered mental status and head trauma. EXAM: CT HEAD WITHOUT CONTRAST CT CERVICAL SPINE WITHOUT CONTRAST TECHNIQUE: Multidetector CT imaging of the head and cervical spine was performed following the standard protocol without intravenous contrast. Multiplanar CT image reconstructions of the cervical spine were also generated. COMPARISON:  CT head and cervical spine 08/02/2015 FINDINGS: CT HEAD FINDINGS Brain: No mass lesion, intraparenchymal hemorrhage or extra-axial collection. No evidence of acute cortical infarct. There is an old right cerebellar infarct. There is periventricular hypoattenuation compatible with chronic microvascular disease. Vascular: No hyperdense vessel or unexpected calcification. Skull: Normal visualized skull base, calvarium and  extracranial soft tissues. Sinuses/Orbits: Complete opacification of both frontal sinuses. Normal orbits. CT CERVICAL SPINE FINDINGS Alignment: Grade 1 C4-C5 anterolisthesis, unchanged. Skull base and vertebrae:  No acute fracture the cervical spine. There is approximately 50% height loss centrally of the T2 vertebral body which is new compared to 08/02/2015, with no specific features to indicate acuity. Soft tissues and spinal canal: No prevertebral fluid or swelling. No visible canal hematoma. Disc levels: Severe right C4-C5 facet hypertrophy and severe left C3-C4 and C4-C5 facet hypertrophy. No bony spinal canal stenosis. Upper chest: No pneumothorax, pulmonary nodule or pleural effusion. Other: Normal visualized paraspinal cervical soft tissues. IMPRESSION: 1. No acute intracranial abnormality. Old right cerebellar infarct and chronic small vessel disease. 2. Complete opacification of both frontal sinuses, likely chronic sinusitis. 3. No acute fracture of the cervical spine. 4. Severe facet hypertrophy at C3-4 and C4-C5 with resultant grade 1 C4-5 anterolisthesis, unchanged. 5. Compression deformity of the T2 vertebral body with approximately 50% height loss has developed since the cervical spine CT of 08/02/2015. There are no specific fractures to indicate acuity and this is probably an old injury. Electronically Signed   By: Ulyses Jarred M.D.   On: 01/28/2018 14:08   Dg Chest Port 1 View  Result Date: Feb 17, 2018 CLINICAL DATA:  Patient with respiratory insufficiency.  Code blue. EXAM: PORTABLE CHEST 1 VIEW COMPARISON:  Chest radiograph 01/31/2018. FINDINGS: Monitoring leads overlie the patient. Stable cardiomegaly with aortic atherosclerosis. Moderate layering left pleural effusion with underlying consolidation. Heterogeneous opacities right lung base. No pneumothorax. Leftward curvature of the midthoracic spine with associated degenerative changes. IMPRESSION: Moderate layering left effusion with  underlying opacities. Right basilar atelectasis. Electronically Signed   By: Lovey Newcomer M.D.   On: 17-Feb-2018 16:50    Microbiology: Recent Results (from the past 240 hour(s))  Urine Culture     Status: Abnormal   Collection Time: 02/03/2018  3:26 PM  Result Value Ref Range Status   Specimen Description   Final    URINE, RANDOM Performed at [facility] [address] [place], [place] [address]    Special Requests   Final    NONE Performed at [facility], [place] [address] [place], [place] [address]    Culture >=100,000 COLONIES/mL ESCHERICHIA COLI (A)  Final   Report Status 02/08/2018 FINAL  Final   Organism ID, Bacteria ESCHERICHIA COLI (A)  Final      Susceptibility   Escherichia coli - MIC*    AMPICILLIN 8 SENSITIVE Sensitive     CEFAZOLIN <=4 SENSITIVE Sensitive     CEFTRIAXONE <=1 SENSITIVE Sensitive     CIPROFLOXACIN >=4 RESISTANT Resistant     GENTAMICIN <=1 SENSITIVE Sensitive     IMIPENEM <=0.25 SENSITIVE Sensitive     NITROFURANTOIN <=16 SENSITIVE Sensitive     TRIMETH/SULFA <=20 SENSITIVE Sensitive     AMPICILLIN/SULBACTAM 4 SENSITIVE Sensitive     PIP/TAZO <=4 SENSITIVE Sensitive     * >=100,000 COLONIES/mL ESCHERICHIA COLI  Culture, blood (routine x 2)     Status: None (Preliminary result)   Collection Time: 01/25/2018  8:35 PM  Result Value Ref Range Status   Specimen Description   Final    BLOOD RIGHT ANTECUBITAL Performed at [facility] [address], [place], [place] [address]    Special Requests   Final    BOTTLES DRAWN AEROBIC AND ANAEROBIC Blood Culture adequate volume Performed at [facility] [address], [place], [place] [address]    Culture   Final    NO GROWTH 3 DAYS Performed at [facility], [place] [address], [place], [place] [address]  Report Status PENDING  Incomplete  Culture, blood (routine x 2)     Status: None (Preliminary result)   Collection Time:  01/26/2018  8:44 PM  Result Value Ref Range Status   Specimen Description   Final    BLOOD RIGHT ARM Performed at [facility] [address], [place], [place] [address]    Special Requests   Final    BOTTLES DRAWN AEROBIC ONLY Blood Culture results may not be optimal due to an inadequate volume of blood received in culture bottles Performed at [facility] [address], [place], [place] [address]    Culture   Final    NO GROWTH 3 DAYS Performed at [facility], [place] [address], [place], [place] [address]    Report Status PENDING  Incomplete     Labs: Basic Metabolic Panel: Recent Labs  Lab 02/06/2018 1526 02/06/18 0534 2018/02/21 1420  NA 126* 130* 130*  K 4.3 4.1 4.2  CL 88* 96* 91*  CO2 32 23 31  GLUCOSE 98 125* 165*  BUN 21* 13 13  CREATININE 0.50 0.43* 0.31*  CALCIUM 8.8* 8.2* 8.7*  MG 1.9  --   --    Liver Function Tests: Recent Labs  Lab 02/10/2018 1526 02/06/18 0534  AST 24 24  ALT 19 13*  ALKPHOS 120 85  BILITOT 0.2* 0.2*  PROT 6.5 5.1*  ALBUMIN 3.3* 2.5*   No results for input(s): LIPASE, AMYLASE in the last 168 hours. No results for input(s): AMMONIA in the last 168 hours. CBC: Recent Labs  Lab 02/13/2018 1526 02/06/18 0534  WBC 7.1 5.7  NEUTROABS 5.6  --   HGB 14.0 12.0  HCT 41.2 36.9  MCV 88.8 89.8  PLT 411* 286   Cardiac Enzymes: Recent Labs  Lab 01/30/2018 1526  TROPONINI 0.03*   D-Dimer No results for input(s): DDIMER in the last 72 hours. BNP: Invalid input(s): POCBNP CBG: Recent Labs  Lab 02/06/18 0740  GLUCAP 110*   Anemia work up No results for input(s): VITAMINB12, FOLATE, FERRITIN, TIBC, IRON, RETICCTPCT in the last 72 hours. Urinalysis    Component Value Date/Time   COLORURINE YELLOW 01/22/2018 1526   APPEARANCEUR CLOUDY (A) 01/20/2018 1526   LABSPEC 1.005 01/20/2018 1526   PHURINE 9.0 (H) 01/25/2018 1526   GLUCOSEU NEGATIVE 01/31/2018 1526   HGBUR MODERATE (A) 01/26/2018  1526   BILIRUBINUR NEGATIVE 01/31/2018 1526   KETONESUR NEGATIVE 02/16/2018 1526   PROTEINUR NEGATIVE 01/27/2018 1526   UROBILINOGEN 0.2 08/02/2015 1302   NITRITE POSITIVE (A) 02/02/2018 1526   LEUKOCYTESUR LARGE (A) 01/29/2018 1526   Sepsis Labs Invalid input(s): PROCALCITONIN,  WBC,  LACTICIDVEN     SIGNED:  Alma Friendly, MD  [facility] 02/08/2018, 5:33 PM Pager   If 7PM-7AM, please contact night-coverage [email or web address] Password [email or web address]

## 2018-02-20 NOTE — Progress Notes (Signed)
Have alerted MD via text concerning pt's VS after pt fell asleep post Seroquel PO. Have applied O2 via [place] on 2.5L. Pt sleeping. R 30 BP 132/67 at this time. Monitoring closely.

## 2018-02-20 NOTE — Progress Notes (Addendum)
Received a page around 4:20 pm about pt, RN noted patient to be unresponsive with saturation about 75% on 4L of O2. Of note, pt had been initially stable earlier this am when I examined pt, noted to be yelling for a change in her diet to a regular diet instead of the dysphagia diet recommended by SLP. Pt was also noted to be agitated and PO Seroquel prn (which she normally receives in ALF) was given to patient in the morning. Pt was noted to be desaturating intermittently while sleeping, but was still easily arousable, pt was subsequently placed on O2. Wound care assessed pt also and she was yelling in pain. Later this evening became unresponsive, code blue was called. Spoke to daughter who is POA about condition and was verbally told to not proceed with code blue, switch to DNR and comfort measures. Daughter arrived 10 mins later and I discussed in details with her. Called Palliative, and they rec starting morphine prn and would see in am. Palliative consult placed. Pt will be placed on a non-rebreather overnight. Pt still has a pulse, with hypertensive crisis. Plan to start comfort care measures and palliative to see in am if pt makes it through the night.  Daughter will liked to be called if pt passes away, no matter the time

## 2018-02-20 NOTE — Progress Notes (Signed)
PT Cancellation Note  Patient Details Name: Gina Zavala MRN: [id number] DOB: 1921-12-27   Cancelled Treatment:    Reason Eval/Treat Not Completed: Patient declined, no reason specified(pt stated she's upset because she's been given any food, she has a breakfast tray in front of her. She refused PT. Will follow. )   Tamala Ser 2018-02-20, 9:15 AM [phone]

## 2018-02-20 NOTE — Progress Notes (Signed)
PROGRESS NOTE  Gina Zavala IZX:[id number] DOB: Mar 15, 1922 DOA: 02/02/2018 PCP: Josetta Huddle, MD  HPI/Recap of past 24 hours: Gina Zavala is a 84 y.o. female with medical history significant for hypertension, hyponatremia, was brought in by EMS for questionable dizziness.  On 02/04/2018, patient was seen in the ED for possible back pain, was found to be extremely paranoid, agitated stating that ALF staff members are trying to poison her and physically abusing her.  Of note, patient has recently been having some paranoid behavior, family aware.  No signs of physical abuse noted.  Patient was subsequently sent back to ALF. Patient presented again later the same day, with similar complaints and still very agitated. Patient still paranoid, very worried about medications given to her, denies any new complaints.  Patient has been refusing food/medications at ALF.  Patient denies any chest pain, shortness of breath, abdominal pain, nausea/vomiting, fever/chills.  Met patient eating dinner, noted to be coughing after each bite, very talkative.  Patient stating that she does not want to go back to ALF. In the ED, UA positive for UTI, noted to be hyponatremic with sodium of 126.  Noted hypertensive crisis.  Patient was started on IV Rocephin and given IV boluses.  Patient admitted for further management.  Today, pt very upset/yelling at staff members due to change in her diet to dysphagia (mod asp risk), wants a regular diet, still talkative, refusing PT. Denies any chest pain, abdominal pain, fever/chills.   Assessment/Plan: Principal Problem:   UTI (urinary tract infection) Active Problems:   HTN (hypertension)   Hyponatremia   Paranoia (HCC)  UTI Afebrile, no leukocytosis LA 1.2 UA showed large leukocytes, positive nitrite, many bacteria, WBC 21-50 UC showed >100,000 E.coli BC x2 NGTD Continue on IV Rocephin  Hyponatremia Improving Na 126 on admission Likely due to dehydration Status post  IV boluses in the ED Continue IV fluids NS Daily BMP  Hypertensive crisis Improving Start home losartan 25 mg daily PRN IV hydralazine  Agitation/paranoia No known MH/psych diagnosis, but behavior is unchanged/baseline Continue home Seroquel as needed for agitation, Klonopin at bedtime      Code Status: Full  Family Communication: None at bedside  Disposition Plan: SNF.  Assisted living facility does not want patient to come back due to multiple issues   Consultants:  None  Procedures:  None  Antimicrobials:  IV Rocephin  DVT prophylaxis: Lovenox   Objective: Vitals:   Feb 23, 2018 1120 02-23-2018 1242 Feb 23, 2018 1243 Feb 23, 2018 1328  BP:  (!) 153/66    Pulse:  (!) 109 (!) 109 93  Resp:  (!) 26    Temp:      TempSrc:      SpO2: 100% 94% 93% 97%  Weight:        Intake/Output Summary (Last 24 hours) at Feb 23, 2018 1339 Last data filed at Feb 23, 2018 0700 Gross per 24 hour  Intake 1482.5 ml  Output 300 ml  Net 1182.5 ml   Filed Weights   01/21/2018 2201  Weight: 44.5 kg (98 lb)    Exam:   General: NAD, thin, frail, talkative  Cardiovascular: S1, S2 present  Respiratory: CTAB  Abdomen: Soft, nontender, nondistended, bowel sounds present  Musculoskeletal: No pedal edema bilaterally  Skin: Normal  Psychiatry: Still paranoid, talkative, off tangent, otherwise normal   Data Reviewed: CBC: Recent Labs  Lab 02/01/2018 1526 02/06/18 0534  WBC 7.1 5.7  NEUTROABS 5.6  --   HGB 14.0 12.0  HCT 41.2 36.9  MCV 88.8  89.8  PLT 411* 045   Basic Metabolic Panel: Recent Labs  Lab 01/30/2018 1526 02/06/18 0534  NA 126* 130*  K 4.3 4.1  CL 88* 96*  CO2 32 23  GLUCOSE 98 125*  BUN 21* 13  CREATININE 0.50 0.43*  CALCIUM 8.8* 8.2*  MG 1.9  --    GFR: CrCl cannot be calculated (Unknown ideal weight.). Liver Function Tests: Recent Labs  Lab 02/03/2018 1526 02/06/18 0534  AST 24 24  ALT 19 13*  ALKPHOS 120 85  BILITOT 0.2* 0.2*  PROT 6.5 5.1*    ALBUMIN 3.3* 2.5*   No results for input(s): LIPASE, AMYLASE in the last 168 hours. No results for input(s): AMMONIA in the last 168 hours. Coagulation Profile: No results for input(s): INR, PROTIME in the last 168 hours. Cardiac Enzymes: Recent Labs  Lab 01/20/2018 1526  TROPONINI 0.03*   BNP (last 3 results) No results for input(s): PROBNP in the last 8760 hours. HbA1C: No results for input(s): HGBA1C in the last 72 hours. CBG: Recent Labs  Lab 02/06/18 0740  GLUCAP 110*   Lipid Profile: No results for input(s): CHOL, HDL, LDLCALC, TRIG, CHOLHDL, LDLDIRECT in the last 72 hours. Thyroid Function Tests: No results for input(s): TSH, T4TOTAL, FREET4, T3FREE, THYROIDAB in the last 72 hours. Anemia Panel: No results for input(s): VITAMINB12, FOLATE, FERRITIN, TIBC, IRON, RETICCTPCT in the last 72 hours. Urine analysis:    Component Value Date/Time   COLORURINE YELLOW 01/21/2018 1526   APPEARANCEUR CLOUDY (A) 01/29/2018 1526   LABSPEC 1.005 02/10/2018 1526   PHURINE 9.0 (H) 01/24/2018 1526   GLUCOSEU NEGATIVE 02/03/2018 1526   HGBUR MODERATE (A) 01/31/2018 1526   BILIRUBINUR NEGATIVE 02/14/2018 [place] 02/08/2018 1526   PROTEINUR NEGATIVE 02/10/2018 1526   UROBILINOGEN 0.2 08/02/2015 1302   NITRITE POSITIVE (A) 01/28/2018 1526   LEUKOCYTESUR LARGE (A) 01/23/2018 1526   Sepsis Labs: '@LABRCNTIP' (procalcitonin:4,lacticidven:4)  ) Recent Results (from the past 240 hour(s))  Urine Culture     Status: Abnormal (Preliminary result)   Collection Time: 02/06/2018  3:26 PM  Result Value Ref Range Status   Specimen Description   Final    URINE, RANDOM Performed at [facility], [place] [address], [place], [place] [address]    Special Requests   Final    NONE Performed at [facility], [place] [address], [place], [place] [address]    Culture (A)  Final    >=100,000 COLONIES/mL ESCHERICHIA COLI SUSCEPTIBILITIES TO  FOLLOW Performed at [facility], [place] [address] [place], [place] [address]    Report Status PENDING  Incomplete  Culture, blood (routine x 2)     Status: None (Preliminary result)   Collection Time: 02/14/2018  8:35 PM  Result Value Ref Range Status   Specimen Description   Final    BLOOD RIGHT ANTECUBITAL Performed at [facility] [address] [place], [place] [address]    Special Requests   Final    BOTTLES DRAWN AEROBIC AND ANAEROBIC Blood Culture adequate volume Performed at [facility] [address] [place], [place] [address]    Culture   Final    NO GROWTH 2 DAYS Performed at [facility] [address], [place], [place] [address]    Report Status PENDING  Incomplete  Culture, blood (routine x 2)     Status: None (Preliminary result)   Collection Time: 02/14/2018  8:44 PM  Result Value  Ref Range Status   Specimen Description   Final    BLOOD RIGHT ARM Performed at [facility] [address], [place], [place] [address]    Special Requests   Final    BOTTLES DRAWN AEROBIC ONLY Blood Culture results may not be optimal due to an inadequate volume of blood received in culture bottles Performed at [facility] [address], [place], [place] [address]    Culture   Final    NO GROWTH 2 DAYS Performed at [facility] [address] [place], [place] [address]    Report Status PENDING  Incomplete      Studies: No results found.  Scheduled Meds: . clonazePAM  0.5 mg Oral QHS  . divalproex  125 mg Oral Q12H  . enoxaparin (LOVENOX) injection  30 mg Subcutaneous QHS  . losartan  25 mg Oral Daily  . multivitamin with minerals  1 tablet Oral Daily  . MUSCLE RUB  1 application Topical BID  . sodium chloride flush  3 mL Intravenous Q12H  . sodium chloride HYPERTONIC  4 mL Nebulization Daily  . zinc oxide  1 application Topical Daily    Continuous Infusions: . sodium  chloride    . cefTRIAXone (ROCEPHIN)  IV Stopped (02/06/18 1812)     LOS: 2 days     Alma Friendly, MD [facility]   If 7PM-7AM, please contact night-coverage [email or web address] Password [email or web address] 02/16/18, 1:39 PM

## 2018-02-20 NOTE — ED Provider Notes (Signed)
Called to room, code blue.  With sternal rub/stimuli pt opens eyes.  Patient is breathing on own but in resp distress. Stat pcxr. Cbg. Abg.  Pts attending contacted by nursing staff - they are in process of re-clarifying pts code status.  Hospitalist/pcp in room and assuming further care of pt.    Cathren Laine, MD 08-Feb-2018 [phone]

## 2018-02-20 NOTE — Progress Notes (Signed)
Rt called to room 1505 due to pt condition. Pt was placed on 100% NRB due low sats and pt not responding. ABG pending no further orders for RT at this time. MD on phone with family pt made DNR.

## 2018-02-20 NOTE — Progress Notes (Signed)
Chaplain on site at [facility] and responded to this Code. It was reported by the Dr. present that the patient is a 84 year female with full code status. The daughter was notified and code status for the patient was changed to comfort care doing this code episode. Chaplain will follow up as needed. Chaplain Janell Quiet [phone]

## 2018-02-20 NOTE — Progress Notes (Signed)
Rt went to do assessment on pt for CPT Q4. Pt was given Seroquel PO and will not wake up. Pt vitals HR 107, sats 93% on 2LPM [place], RR 22, BS-SL RH. Pt will not wake up to cough. MD notified of pt from RN early this am. Rt will assess pt on pm shift again for CPT Q4.

## 2018-02-20 NOTE — Consult Note (Signed)
WOC Nurse wound consult note Reason for Consult: sacral wounds Wound type: pressure, friction, shear, MASD from incontinence Pressure Injury POA: No Measurement: Two small stage 2 wounds are present to the sacral/coccyx area, separated by a [address] of skin.  The entire area measures 2.2 cm x 1.7 cm without any appreciable depth.  Wound bed: pink open area surrounded by dark pink/purpleish that blanches Drainage (amount, consistency, odor) scant serosanginous on foam dressing Periwound: fragile, darkened pink/purpleish but blanches, wet from urinary incontinence Dressing procedure/placement/frequency: Replicare hydrocolloid every 5 days and prn. I have also added a replacement air mattress and Prevalon boots while patient is in the bed, and a side to side turning schedule. Monitor the wound area(s) for worsening of condition such as: Signs/symptoms of infection,  Increase in size,  Development of or worsening of odor, Development of pain, or increased pain at the affected locations.  Notify the medical team if any of these develop.  Thank you for the consult.  Discussed plan of care with the patient and bedside nurse.  WOC nurse will not follow at this time.  Please re-consult the WOC team if needed.  Helmut Muster, RN, MSN, CWOCN, CNS-BC, pager [phone]

## 2018-02-20 NOTE — Progress Notes (Signed)
Around 1615 this afternoon, writer went to pt's room and noted pt saturations to be dropping. Pt has been placed on O2 today due to decrease saturations and increased respirations. MD made aware. At this time, pt cannot be aroused with sternal rub. Has opened eyes, but nothing voiced. O2 at this time 78%.Pt placed on NRB. O2 saturations increased to 84%. Pulse is all over the place with pulse going 40's to lower 100's. BP elevated. MD made aware of call to RR. MD came to room quickly and a Code Blue was called. MD spoke with pt's daughter, who has made pt a NO CODE. Pt is now on NRB with sats 91%. Pulse 52 Comfort measures continue. Family came to hospital, but left about 1730.

## 2018-02-20 NOTE — Progress Notes (Signed)
Writer went to pt's room and noted pt without a pulse. Tax adviser, Dwana Curd, RN, verifying pt's death at 02-17-2025. Writer has made night MD aware of death. Pt's daughter has been called. AC made aware.

## 2018-02-20 NOTE — Progress Notes (Signed)
This shift Clinical research associate and NT delivered pt to the morgue with Catering manager. Bed placement called and daughter Stevan Born called to  And made aware of personal belongings remaining =on the unit for pick up tomorrow.

## 2018-02-20 DEATH — deceased
